# Patient Record
Sex: Male | Born: 1955
Health system: Southern US, Community
[De-identification: ages and names within clinical notes are randomized; demographics above are authoritative.]

## PROBLEM LIST (undated history)

## (undated) DIAGNOSIS — M779 Enthesopathy, unspecified: Secondary | ICD-10-CM

## (undated) DIAGNOSIS — I10 Essential (primary) hypertension: Secondary | ICD-10-CM

## (undated) DIAGNOSIS — K219 Gastro-esophageal reflux disease without esophagitis: Secondary | ICD-10-CM

## (undated) DIAGNOSIS — J45909 Unspecified asthma, uncomplicated: Secondary | ICD-10-CM

## (undated) DIAGNOSIS — E785 Hyperlipidemia, unspecified: Secondary | ICD-10-CM

## (undated) DIAGNOSIS — K635 Polyp of colon: Secondary | ICD-10-CM

## (undated) DIAGNOSIS — R011 Cardiac murmur, unspecified: Secondary | ICD-10-CM

## (undated) DIAGNOSIS — T7840XA Allergy, unspecified, initial encounter: Secondary | ICD-10-CM

## (undated) HISTORY — DX: Essential (primary) hypertension: I10

## (undated) HISTORY — PX: NASAL POLYP SURGERY: SHX186

## (undated) HISTORY — DX: Allergy, unspecified, initial encounter: T78.40XA

## (undated) HISTORY — DX: Hyperlipidemia, unspecified: E78.5

## (undated) HISTORY — DX: Cardiac murmur, unspecified: R01.1

## (undated) HISTORY — PX: TONSILLECTOMY: SUR1361

## (undated) HISTORY — DX: Unspecified asthma, uncomplicated: J45.909

## (undated) HISTORY — DX: Polyp of colon: K63.5

---

## 2011-09-09 ENCOUNTER — Encounter: Payer: Self-pay | Admitting: *Deleted

## 2011-09-09 ENCOUNTER — Emergency Department (INDEPENDENT_AMBULATORY_CARE_PROVIDER_SITE_OTHER): Payer: Managed Care, Other (non HMO)

## 2011-09-09 ENCOUNTER — Emergency Department (HOSPITAL_BASED_OUTPATIENT_CLINIC_OR_DEPARTMENT_OTHER)
Admission: EM | Admit: 2011-09-09 | Discharge: 2011-09-09 | Disposition: A | Discharge status: Other Acute Inpatient Hospital | Payer: Managed Care, Other (non HMO) | Attending: Emergency Medicine | Admitting: Emergency Medicine

## 2011-09-09 DIAGNOSIS — R070 Pain in throat: Secondary | ICD-10-CM | POA: Insufficient documentation

## 2011-09-09 DIAGNOSIS — S128XXA Fracture of other parts of neck, initial encounter: Secondary | ICD-10-CM | POA: Insufficient documentation

## 2011-09-09 DIAGNOSIS — R079 Chest pain, unspecified: Secondary | ICD-10-CM

## 2011-09-09 DIAGNOSIS — S199XXA Unspecified injury of neck, initial encounter: Secondary | ICD-10-CM

## 2011-09-09 DIAGNOSIS — IMO0002 Reserved for concepts with insufficient information to code with codable children: Secondary | ICD-10-CM

## 2011-09-09 DIAGNOSIS — Y93H2 Activity, gardening and landscaping: Secondary | ICD-10-CM

## 2011-09-09 DIAGNOSIS — R220 Localized swelling, mass and lump, head: Secondary | ICD-10-CM

## 2011-09-09 DIAGNOSIS — M542 Cervicalgia: Secondary | ICD-10-CM | POA: Insufficient documentation

## 2011-09-09 DIAGNOSIS — S0993XA Unspecified injury of face, initial encounter: Secondary | ICD-10-CM

## 2011-09-09 DIAGNOSIS — R221 Localized swelling, mass and lump, neck: Secondary | ICD-10-CM

## 2011-09-09 MED ORDER — DEXAMETHASONE SODIUM PHOSPHATE 10 MG/ML IJ SOLN
10.0000 mg | Freq: Once | INTRAMUSCULAR | Status: AC
  Administered 2011-09-09: 10 mg via INTRAVENOUS
  Filled 2011-09-09: qty 1

## 2011-09-09 MED ORDER — IOHEXOL 350 MG/ML SOLN
100.0000 mL | Freq: Once | INTRAVENOUS | Status: AC | PRN
  Administered 2011-09-09: 100 mL via INTRAVENOUS

## 2011-09-09 MED ORDER — MORPHINE SULFATE 4 MG/ML IJ SOLN
8.0000 mg | Freq: Once | INTRAMUSCULAR | Status: AC
  Administered 2011-09-09: 8 mg via INTRAVENOUS
  Filled 2011-09-09: qty 2

## 2011-09-09 MED ORDER — SODIUM CHLORIDE 0.9 % IV SOLN
Freq: Once | INTRAVENOUS | Status: AC
  Administered 2011-09-09: 500 mL via INTRAVENOUS

## 2011-09-09 NOTE — ED Provider Notes (Signed)
History     CSN: [id number] Arrival date & time: 09/09/2011  1:42 PM  Chief Complaint  Patient presents with  . Trauma    (Consider location/radiation/quality/duration/timing/severity/associated sxs/prior treatment) HPI  History reviewed. No pertinent past medical history.  History reviewed. No pertinent past surgical history.  History reviewed. No pertinent family history.  History  Substance Use Topics  . Smoking status: Not on file  . Smokeless tobacco: Not on file  . Alcohol Use: Yes      Review of Systems  Allergies  Niacin and related  Home Medications  No current outpatient prescriptions on file.  BP 123/69  Pulse 74  Temp(Src) 98.6 F (37 C) (Oral)  Resp 20  Ht 5\' 7"  (1.702 m)  Wt 180 lb (81.647 kg)  BMI 28.19 kg/m2  SpO2 98%  Physical Exam  ED Course  Procedures (including critical care time)  Labs Reviewed - No data to display Dg Neck Soft Tissue  09/09/2011  *RADIOLOGY REPORT*  Clinical Data: Injured by blunt lawnmower handle to neck. Struck in "Adam's apple"  NECK SOFT TISSUES - 1+ VIEW  Comparison:  None.  Findings:  There is no evidence of retropharyngeal soft tissue swelling or epiglottic enlargement.  The cervical airway is unremarkable and no radio-opaque foreign body identified.  IMPRESSION: Negative.  Original Report Authenticated By: Elsie Stain, M.D.   Dg Chest 2 View  09/09/2011  *RADIOLOGY REPORT*  Clinical Data: Blunt neck trauma.  Chest pain.  CHEST - 2 VIEW  Comparison: Soft tissue neck earlier today.  Findings:  The heart size and mediastinal contours are within normal limits.  Both lungs are clear.  The visualized skeletal structures are unremarkable. There is no visible pneumomediastinum. Trachea is midline.  Clavicles are symmetric and normal.  IMPRESSION: No active cardiopulmonary disease.  Original Report Authenticated By: Elsie Stain, M.D.   Ct Soft Tissue Neck W Contrast  09/09/2011  *RADIOLOGY REPORT*  Clinical Data:  Blunt trauma to the neck with lawn mower. Handle struck patient in Adam's apple.  CT NECK WITH CONTRAST  Technique:  Multidetector CT imaging of the neck was performed with intravenous contrast.  Contrast: OMNIPAQUE IOHEXOL 350 MG/ML IV SOLN  Comparison: Neck films 09/09/2011.  Findings: The anterior portion of the right thyroid cartilage is angled acutely at its junction with the more cartilagenous  body (series 7, images 59-61). A second fracture is suspected just below the thyroid notch.  There is slight supraglottic soft tissue swelling on the right.  The air column is slightly deviated from right to left but there is no significant narrowing.  True and false vocal cords appear intact.  Cricoid cartilage and arytenoids appear normally located.  There is no external soft tissue neck hematoma.  The hyoid cartilage is intact.  Craniocervical vasculature widely patent.  There is mild cervical spondylosis.  There is no adenopathy.  Salivary glands unremarkable.  Mandible and maxilla are intact.  There is mild chronic ethmoid sinus disease.  Visualized intracranial compartment unremarkable.  Subglottic region normal.  No pneumothorax.  No clavicular or sternal fracture.  IMPRESSION: Suspect  right thyroid cartilage fracture.  There is slight right- sided supraglottic soft tissue swelling and mild displacement of the supraglottic airway right-to-left.  Discussed with EDP.  Original Report Authenticated By: Elsie Stain, M.D.     No diagnosis found.    MDM  CT scan showed fracture of thyroid cartilage.  Arrangements made for transfer to [facility] to see a Scientist, forensic.  Airway patent, no stridor at time of transfer.       Geoffery Lyons, MD 09/10/11 2116

## 2011-09-09 NOTE — ED Notes (Signed)
Pt in xray

## 2011-09-09 NOTE — ED Notes (Signed)
Report given to Jesusita Oka, RN with [facility]- advise transport will be after 1900

## 2011-09-09 NOTE — ED Notes (Signed)
Pt states he was moving stuff and the cart slipped and the handlebar hit him in the neck. "adams apple pushed in" Now hurts in chest.

## 2011-09-09 NOTE — ED Provider Notes (Signed)
History     CSN: [id number] Arrival date & time: 09/09/2011  1:42 PM  Chief Complaint  Patient presents with  . Trauma     HPI Patient was removing his aerator from his truck when it rolled backwards and the handle struck him in the anterior neck.  This occurred just prior to arrival.  Reports ongoing pain in his throat and neck.  He's had no difficulty breathing.  He reports is able to keep down fluids without difficulty her pain.  His wife does report his voice sounds slightly hoarse and different.  He now reports mild superior chest pain without shortness of breath.  He's had no headache or weakness of his arms or legs.  He has no posterior neck pain.  He has not had fever.  His pain is moderate in nature it is sore and description.  He reports that it feels like his Adam's apple is pushed in.    History reviewed. No pertinent past medical history.  History reviewed. No pertinent past surgical history.  History reviewed. No pertinent family history.  History  Substance Use Topics  . Smoking status: Not on file  . Smokeless tobacco: Not on file  . Alcohol Use: Yes      Review of Systems  All other systems reviewed and are negative.    Allergies  Niacin and related  Home Medications  No current outpatient prescriptions on file.  BP 123/69  Pulse 74  Temp(Src) 98.6 F (37 C) (Oral)  Resp 20  Ht 5\' 7"  (1.702 m)  Wt 180 lb (81.647 kg)  BMI 28.19 kg/m2  SpO2 98%  Physical Exam  Nursing note and vitals reviewed. Constitutional: He is oriented to person, place, and time. He appears well-developed and well-nourished.  HENT:  Head: Normocephalic and atraumatic.  Eyes: EOM are normal.  Neck: Normal range of motion. Neck supple. No tracheal deviation present. No thyromegaly present.       No crepitus.  Mild dysphonia.  Trachea is midline.  No external signs of trauma including no bruising laceration or abrasion  Cardiovascular: Normal rate, regular rhythm, normal  heart sounds and intact distal pulses.   Pulmonary/Chest: Effort normal and breath sounds normal. No stridor. No respiratory distress.  Abdominal: Soft. He exhibits no distension. There is no tenderness.  Neurological: He is alert and oriented to person, place, and time.  Skin: Skin is warm and dry.  Psychiatric: He has a normal mood and affect. Judgment normal.    ED Course  Procedures (including critical care time)  Labs Reviewed - No data to display Dg Neck Soft Tissue  09/09/2011  *RADIOLOGY REPORT*  Clinical Data: Injured by blunt lawnmower handle to neck. Struck in "Adam's apple"  NECK SOFT TISSUES - 1+ VIEW  Comparison:  None.  Findings:  There is no evidence of retropharyngeal soft tissue swelling or epiglottic enlargement.  The cervical airway is unremarkable and no radio-opaque foreign body identified.  IMPRESSION: Negative.  Original Report Authenticated By: Elsie Stain, M.D.   Dg Chest 2 View  09/09/2011  *RADIOLOGY REPORT*  Clinical Data: Blunt neck trauma.  Chest pain.  CHEST - 2 VIEW  Comparison: Soft tissue neck earlier today.  Findings:  The heart size and mediastinal contours are within normal limits.  Both lungs are clear.  The visualized skeletal structures are unremarkable. There is no visible pneumomediastinum. Trachea is midline.  Clavicles are symmetric and normal.  IMPRESSION: No active cardiopulmonary disease.  Original Report Authenticated By: Jonny Ruiz  T. CURNES, M.D.   I personally reviewed the patient's imaging studies  No diagnosis found.    MDM  Plain films without any acute pathology.  Given his ongoing pain.  A CT scan of his neck with IV contrast is then obtained to evaluate for neck hematoma or other pathology.  The patient is tolerating by mouth fluids in the emergency department without difficulty or pain.  My suspicion for esophageal injury is low at this time.  Care to Dr. Judd Lien at this time to followup on CT scan        Lyanne Co,  MD 09/09/11 1640

## 2011-09-09 NOTE — ED Notes (Signed)
[facility] here to transport- report called to Victorio Palm, RN [facility] ED-

## 2012-02-16 ENCOUNTER — Observation Stay (HOSPITAL_COMMUNITY): Payer: Managed Care, Other (non HMO)

## 2012-02-16 ENCOUNTER — Encounter (HOSPITAL_COMMUNITY): Payer: Self-pay | Admitting: Physical Medicine and Rehabilitation

## 2012-02-16 ENCOUNTER — Observation Stay (HOSPITAL_COMMUNITY)
Admission: EM | Admit: 2012-02-16 | Discharge: 2012-02-16 | Disposition: A | Discharge status: Hospice | Payer: Managed Care, Other (non HMO) | Attending: Emergency Medicine | Admitting: Emergency Medicine

## 2012-02-16 ENCOUNTER — Emergency Department (HOSPITAL_COMMUNITY): Payer: Managed Care, Other (non HMO)

## 2012-02-16 ENCOUNTER — Other Ambulatory Visit: Payer: Self-pay

## 2012-02-16 DIAGNOSIS — R911 Solitary pulmonary nodule: Secondary | ICD-10-CM

## 2012-02-16 DIAGNOSIS — R079 Chest pain, unspecified: Principal | ICD-10-CM | POA: Insufficient documentation

## 2012-02-16 DIAGNOSIS — K219 Gastro-esophageal reflux disease without esophagitis: Secondary | ICD-10-CM | POA: Insufficient documentation

## 2012-02-16 HISTORY — DX: Enthesopathy, unspecified: M77.9

## 2012-02-16 HISTORY — DX: Gastro-esophageal reflux disease without esophagitis: K21.9

## 2012-02-16 LAB — BASIC METABOLIC PANEL
CO2: 29 mEq/L (ref 19–32)
Calcium: 9.7 mg/dL (ref 8.4–10.5)
Chloride: 104 mEq/L (ref 96–112)
Creatinine, Ser: 1.08 mg/dL (ref 0.50–1.35)
Glucose, Bld: 105 mg/dL — ABNORMAL HIGH (ref 70–99)

## 2012-02-16 LAB — CBC
HCT: 45.9 % (ref 39.0–52.0)
Hemoglobin: 16.2 g/dL (ref 13.0–17.0)
MCH: 30.1 pg (ref 26.0–34.0)
MCV: 85.2 fL (ref 78.0–100.0)
Platelets: 286 10*3/uL (ref 150–400)
RBC: 5.39 MIL/uL (ref 4.22–5.81)
WBC: 6.6 10*3/uL (ref 4.0–10.5)

## 2012-02-16 MED ORDER — SODIUM CHLORIDE 0.9 % IV BOLUS (SEPSIS)
500.0000 mL | Freq: Once | INTRAVENOUS | Status: AC
  Administered 2012-02-16: 500 mL via INTRAVENOUS

## 2012-02-16 MED ORDER — GI COCKTAIL ~~LOC~~
30.0000 mL | Freq: Once | ORAL | Status: AC
  Administered 2012-02-16: 30 mL via ORAL
  Filled 2012-02-16: qty 30

## 2012-02-16 MED ORDER — METOPROLOL TARTRATE 1 MG/ML IV SOLN
INTRAVENOUS | Status: AC
  Administered 2012-02-16: 5 mg
  Filled 2012-02-16: qty 5

## 2012-02-16 MED ORDER — SODIUM CHLORIDE 0.9 % IV BOLUS (SEPSIS)
1000.0000 mL | Freq: Once | INTRAVENOUS | Status: AC
  Administered 2012-02-16: 1000 mL via INTRAVENOUS

## 2012-02-16 MED ORDER — METOPROLOL TARTRATE 1 MG/ML IV SOLN
5.0000 mg | Freq: Once | INTRAVENOUS | Status: AC
  Administered 2012-02-16: 5 mg via INTRAVENOUS

## 2012-02-16 MED ORDER — IOHEXOL 350 MG/ML SOLN: 80.0000 mL | Freq: Once | INTRAVENOUS | Status: DC | PRN

## 2012-02-16 MED ORDER — MORPHINE SULFATE 4 MG/ML IJ SOLN
4.0000 mg | Freq: Once | INTRAMUSCULAR | Status: AC
  Administered 2012-02-16: 4 mg via INTRAVENOUS
  Filled 2012-02-16: qty 1

## 2012-02-16 MED ORDER — NITROGLYCERIN 0.4 MG SL SUBL
0.4000 mg | SUBLINGUAL_TABLET | Freq: Once | SUBLINGUAL | Status: AC
  Administered 2012-02-16: 0.4 mg via SUBLINGUAL

## 2012-02-16 MED ORDER — ONDANSETRON HCL 4 MG/2ML IJ SOLN
4.0000 mg | Freq: Once | INTRAMUSCULAR | Status: AC
  Administered 2012-02-16: 4 mg via INTRAVENOUS
  Filled 2012-02-16: qty 2

## 2012-02-16 NOTE — ED Notes (Signed)
Patient is resting comfortably. Pt returned from CT, denies any pain

## 2012-02-16 NOTE — ED Notes (Signed)
Pt presents to department via [facility] from home for evaluation of midsternal chest pain. Onset last night, pt states pain returned this morning while sitting at desk. States burning sensation radiating from middle of chest up to jaw and around to back. Denies pain upon arrival to ED. 18g LAC. 324 ASA at home. He is alert and oriented x4.

## 2012-02-16 NOTE — ED Notes (Signed)
Family at bedside. 

## 2012-02-16 NOTE — ED Provider Notes (Signed)
Medical screening examination/treatment/procedure(s) were performed by non-physician practitioner and as supervising physician I was immediately available for consultation/collaboration.   Gavin Pound. Zareena Willis, MD 02/16/12 1616

## 2012-02-16 NOTE — ED Provider Notes (Signed)
Pt in CDU on CP protocol.  Coronary CT has resulted and I have spoken w/ radiology.  No evidence of atherosclerosis and Calcium Score of 0.  There is myocardial bridging of LAD and the RCA extends inside R atrium (both of which are clinically irrelevant at this time).  There is sclerosis of aortic valve and it is possibly bicuspid.  Multiple pulmonary nodules.  All results discussed w/ pt.  He has a cardiologist and I recommended f/u this coming week.  Also recommended that he follow up with his primary care doctor for repeat CT chest in 3-6 months.  Return precautions including change in or worsening of CP discussed.  5:09 PM   Otilio Miu, PA 02/16/12 [phone]

## 2012-02-16 NOTE — Discharge Instructions (Signed)
It would likely benefit you to take an 81mg  aspirin every day.  Follow up with your cardiologist or one of his partners as soon as possible.  You should follow up with your family doctor to have pulmonary nodules rechecked in 3-6 months as well.  Your test results from today are attached.  You should return to the ER if your pain changes or worsens.

## 2012-02-16 NOTE — ED Provider Notes (Signed)
History     CSN: [id number]  Arrival date & time 02/16/12  1023   First MD Initiated Contact with Patient 02/16/12 1028      Chief Complaint  Patient presents with  . Chest Pain    (Consider location/radiation/quality/duration/timing/severity/associated sxs/prior treatment) HPI  Patient presents to the emergency department via EMS for evaluation of chest pains. [name] chest pains began last night and resolved. This morning while patient was sitting at his desk at work he developed chest pain began and called 911. The patient describes the pain as burning sensation from the middle of his chest that goes up to his jaw around. The pain lasted approximately 45 minutes and resolved. The patient was taken 324 mg of aspirin prior to arrival and upon my entering examined the patient is currently pain-free. The patient appears to be in no distress at this time is awake alert and oriented.  Past Medical History  Diagnosis Date  . GERD (gastroesophageal reflux disease)   . Tendonitis     History reviewed. No pertinent past surgical history.  History reviewed. No pertinent family history.  History  Substance Use Topics  . Smoking status: Never Smoker   . Smokeless tobacco: Not on file  . Alcohol Use: Yes      Review of Systems  All other systems reviewed and are negative.    Allergies  Niacin and related  Home Medications   Current Outpatient Rx  Name Route Sig Dispense Refill  . THERA M PLUS PO Oral Take 1 tablet by mouth daily.      BP 152/65  Pulse 53  Temp(Src) 98.7 F (37.1 C) (Oral)  Resp 17  SpO2 100%  Physical Exam  Nursing note and vitals reviewed. Constitutional: He appears well-developed and well-nourished. No distress.  HENT:  Head: Normocephalic and atraumatic.  Eyes: Pupils are equal, round, and reactive to light.  Neck: Normal range of motion. Neck supple.  Cardiovascular: Normal rate and regular rhythm.   Pulmonary/Chest: Effort normal.  No respiratory distress. He has no wheezes. He has no rales.  Abdominal: Soft.  Neurological: He is alert.  Skin: Skin is warm and dry. He is not diaphoretic.    ED Course  Procedures (including critical care time)  Labs Reviewed  BASIC METABOLIC PANEL - Abnormal; Notable for the following:    Glucose, Bld 105 (*)    GFR calc non Af Amer 76 (*)    GFR calc Af Amer 88 (*)    All other components within normal limits  TROPONIN I  CBC   Dg Chest 2 View  02/16/2012  *RADIOLOGY REPORT*  Clinical Data: Chest pain.  CHEST - 2 VIEW  Comparison: 09/09/2011  Findings: Heart and mediastinal contours are within normal limits. No focal opacities or effusions.  No acute bony abnormality.  Mild hyperinflation of the lungs.  IMPRESSION: No active cardiopulmonary disease.  Original Report Authenticated By: Cyndie Chime, M.D.     No diagnosis found.    MDM   Date: 02/16/2012  Rate: 55  Rhythm: normal sinus rhythm  QRS Axis: normal  Intervals: normal  ST/T Wave abnormalities: normal  Conduction Disutrbances:none  Narrative Interpretation: ventricular premature complexes  Old EKG Reviewed: none available  Pt discussed with Dr. Oletta Lamas who agrees that patient is a good candidate for CPP in CDU. I have spoken with Lorenz Coaster, PA-C in the CDU who has agreed to assume care of patient while on CPP. I have discussed the process with the  patient who is agreeable and willing to go through with the extra testing.        Dorthula Matas, PA 02/16/12 1513

## 2012-02-16 NOTE — ED Provider Notes (Signed)
Medical screening examination/treatment/procedure(s) were performed by non-physician practitioner and as supervising physician I was immediately available for consultation/collaboration. ECG shows no ischemia.  Troponin is neg.  Pt is 55, unexplained episodes of substernal CP's.  Will place in CDU for CP protocol.  Vital signs unremarkable.    Gavin Pound. Quanetta Truss, MD 02/16/12 [phone]

## 2012-02-16 NOTE — ED Notes (Signed)
Pt presents to department for evaluation of midsternal chest pain radiating to jaw and around to back. Onset last night, states pain went away but then returned this morning while working. No diaphoresis. States he is always SOB due to murmur. Skin warm and dry. Denies pain at the time. Lung sounds clear and equal bilaterally. No history of heart issues, states only GERD. Pt is conscious alert and oriented x4. No signs of acute distress at the time.

## 2012-02-16 NOTE — ED Provider Notes (Signed)
56 y/o Male PMH only sig for GERD, to the CDU on chest pain protocol. Chest pain began last night and recurred this morning, burning, central chest with rad to jaw. Spontaneous resolution after 45 minutes. Awaiting coronary CT.   On my assessment, pt is alert, oriented, and in NAD. Lungs are CTAB. Heart RRR. Abd soft, NT. MAEW. No needs at this time.    3:30 PM Pt has returned from coronary CT. Per discussion with Dr Olevia Bowens, pt received additional contrast that typical for the study due to difficulties with machine function as well as IV access. Recommendation to push fluids, IV NS bolus ordered. Pt in no distress.     4:02 PM Report given to PA Schinlever, who will continue care in the CDU.  Shaaron Adler, [place] 02/16/12 [phone]

## 2012-02-17 NOTE — ED Provider Notes (Signed)
Medical screening examination/treatment/procedure(s) were performed by non-physician practitioner and as supervising physician I was immediately available for consultation/collaboration.   Gavin Pound. [place] Lamons, MD 02/17/12 1610

## 2012-02-20 NOTE — Progress Notes (Signed)
Observation review is complete for 3/15.

## 2014-10-20 ENCOUNTER — Telehealth: Payer: Self-pay | Admitting: Cardiovascular Disease

## 2014-10-20 NOTE — Telephone Encounter (Signed)
Received records from [facility] (Dr Caryl AdaJenyung Andy Chiu) for appointment with Dr Tresa EndoKelly on 12/03/14.  [facility] Dr Landry DykeKelly's schedule on 12/03/14.  lp

## 2014-12-03 ENCOUNTER — Encounter: Payer: Self-pay | Admitting: Cardiovascular Disease

## 2014-12-03 ENCOUNTER — Ambulatory Visit (INDEPENDENT_AMBULATORY_CARE_PROVIDER_SITE_OTHER): Payer: BC Managed Care – PPO | Admitting: Cardiovascular Disease

## 2014-12-03 VITALS — BP 114/68 | HR 62 | Ht 67.0 in | Wt 181.1 lb

## 2014-12-03 DIAGNOSIS — R0789 Other chest pain: Secondary | ICD-10-CM

## 2014-12-03 DIAGNOSIS — I350 Nonrheumatic aortic (valve) stenosis: Secondary | ICD-10-CM

## 2014-12-03 DIAGNOSIS — R5383 Other fatigue: Secondary | ICD-10-CM

## 2014-12-03 DIAGNOSIS — R0600 Dyspnea, unspecified: Secondary | ICD-10-CM

## 2014-12-03 DIAGNOSIS — Z7901 Long term (current) use of anticoagulants: Secondary | ICD-10-CM

## 2014-12-03 DIAGNOSIS — R0602 Shortness of breath: Secondary | ICD-10-CM

## 2014-12-03 DIAGNOSIS — I340 Nonrheumatic mitral (valve) insufficiency: Secondary | ICD-10-CM

## 2014-12-03 DIAGNOSIS — Z79899 Other long term (current) drug therapy: Secondary | ICD-10-CM

## 2014-12-03 DIAGNOSIS — R0609 Other forms of dyspnea: Secondary | ICD-10-CM

## 2014-12-03 NOTE — Patient Instructions (Signed)
Your physician has requested that you have a RIGHT AND LEFT HEART cardiac catheterization performed by Dr. Tresa EndoKelly. Cardiac catheterization is used to diagnose and/or treat various heart conditions. Doctors may recommend this procedure for a number of different reasons. The most common reason is to evaluate chest pain. Chest pain can be a symptom of coronary artery disease (CAD), and cardiac catheterization can show whether plaque is narrowing or blocking your heart's arteries. This procedure is also used to evaluate the valves, as well as measure the blood flow and oxygen levels in different parts of your heart. For further information please visit [email or web address] Please follow instruction sheet, as given.  Your physician recommends that you return for lab work in: 3-5 days prior to the cardiac cath. A chest x-ray takes a picture of the organs and structures inside the chest, including the heart, lungs, and blood vessels. This test can show several things, including, whether the heart is enlarges; whether fluid is building up in the lungs.

## 2014-12-05 ENCOUNTER — Encounter: Payer: Self-pay | Admitting: Cardiovascular Disease

## 2014-12-05 DIAGNOSIS — R0600 Dyspnea, unspecified: Secondary | ICD-10-CM | POA: Insufficient documentation

## 2014-12-05 DIAGNOSIS — R0789 Other chest pain: Secondary | ICD-10-CM | POA: Insufficient documentation

## 2014-12-05 DIAGNOSIS — R0609 Other forms of dyspnea: Secondary | ICD-10-CM | POA: Insufficient documentation

## 2014-12-05 DIAGNOSIS — I340 Nonrheumatic mitral (valve) insufficiency: Secondary | ICD-10-CM | POA: Insufficient documentation

## 2014-12-05 DIAGNOSIS — I350 Nonrheumatic aortic (valve) stenosis: Secondary | ICD-10-CM | POA: Insufficient documentation

## 2014-12-05 NOTE — Progress Notes (Addendum)
Patient ID: Jay Hawkins, male   DOB: 06-18-1956, 59 y.o.   MRN: [id number]     PATIENT PROFILE: Jay BillowDon Dome is a 59 y.o. male who is referred by Dr. Eulah CitizenAndrew Chiu and the patient's interest in a second opinion concerning his valvular heart disease.   HPI:  Jay BillowDon Salomone is a 59 y.o. male who was born [place] the [place]  He has been told of having a cardiac murmur.  In June 2010 he underwent an echo Doppler study which revealed normal systolic function with left to hypertrophy and abnormal diastolic function.  His left atrium was mildly dilated.  Dense for a mildly calcified right coronary cusp of his aortic valve and he had a mean transvalvular aortic gradient of 10 mm and a peak gradient of 19 mm with mild aortic insufficiency.  There was mild mitral annular calcification with mild to moderate eccentric mitral regurgitation.  He was noted to have trivial pulmonic regurgitation.  In May 2013.  A subsequent echo Doppler study done at [facility] revealed an ejection fraction of 65% with mild LVH, mild left atrial enlargement, to have mild sclerosis with good mobility across his aortic valve without significant gradient or regurgitation.  His mitral regurgitation was now interpreted as mild.  In 2013 he underwent a CT angiography of his heart at which time his coronary artery calcium score was 0.  There was a short segment of shallow myocardial bridging in the mid LAD with less than 25% stenosis and was not felt to be due to the patient's symptoms of shortness of breath.  His aortic valve was sclerotic with possible commissural fusion between the noncoronary and right cusp, although this was not definitive.  He was also noted to have 2 very small pulmonary nodules in the left lung measuring up to 6 mm.  Patient has noticed episodes of increasing shortness of breath as well as chest tightness.  In addition to increasing fatigability.  He particularly has noticed shortness of  breath with step walking.  Subsequent evaluation in 2015 has revealed carotid duplex imaging which did not demonstrate any significant carotid artery stenosis and he had normal antegrade vertebral flow bilaterally.  An echo Doppler study in September 2015 showed normal systolic function with an ejection fraction at 70%, mild left atrial enlargement, mitral annular calcification with mild mitral regurgitation, and his aortic valve was sclerotic with mild stenosis and mild regurgitation with a mean gradient of 14.6 and a peak gradient now at 29 mm.  Because of recent increasing symptoms he underwent a stress echo in November 2015 which was normal without evidence for stress-induced wall motion abnormality.  The patient was last seen by Dr. Rhona Leavenshiu in November 2015.  The patient feels that his symptoms are getting worse and he is concerned that he may ultimately need valve surgery.  The patient now presents for cardiology evaluation with me.  He admits to chest tightness with activity.  He admits to rare episodes of lightheadedness.  He admits to exertional dyspnea.  He also admits to fatigue.  He is convinced that he has had a significant decline in exercise tolerance.  He is unaware of any snoring.  An Epworth Sleepiness Scale score today was calculated and this endorsed that 11 suggesting mild hypersomnolence.  Past Medical History  Diagnosis Date  . GERD (gastroesophageal reflux disease)   . Tendonitis     Surgical history is notable for tonsillectomy as well as nasal polyp removal in 2014.  Allergies  Allergen Reactions  . Niacin And Related Itching    Current Outpatient Prescriptions  Medication Sig Dispense Refill  . Cetirizine HCl (ZYRTEC ALLERGY PO) Take by mouth as needed.    Marland Kitchen lisinopril (PRINIVIL,ZESTRIL) 20 MG tablet   9  . Multiple Vitamins-Minerals (THERA M PLUS PO) Take 1 tablet by mouth daily.    . traMADol (ULTRAM) 50 MG tablet Take 50 mg by mouth.     No current  facility-administered medications for this visit.    Socially he was born in the [place]  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  He completed his education in [place]  He is married for 32 years.  There is remote 10 year history of tobacco use but he quit in 1996.  He does drink occasional wine.  He does exercise 3-4 days per week.   Family history is notable that both parents died at age 43 with colon cancer.  He has 3 children ages 32, 61, and 67 and 4 grandchildren.  ROS General: Negative; No fevers, chills, or night sweats HEENT: Negative; No changes in vision or hearing, sinus congestion, difficulty swallowing Pulmonary: Negative; No cough, wheezing, shortness of breath, hemoptysis Cardiovascular:  See HPI;  GI: Negative; No nausea, vomiting, diarrhea, or abdominal pain GU: Negative; No dysuria, hematuria, or difficulty voiding Musculoskeletal: Negative; no myalgias, joint pain, or weakness Hematologic/Oncologic: Negative; no easy bruising, bleeding Endocrine: Negative; no heat/cold intolerance; no diabetes Neuro: Negative; no changes in balance, headaches Skin: Negative; No rashes or skin lesions Psychiatric: Negative; No behavioral problems, depression Sleep: Mild fatigue with minimal daytime sleepiness; no snoring, bruxism, restless legs, hypnogagnic hallucinations Other comprehensive 14 point system review is negative   Physical Exam BP 114/68 mmHg  Pulse 62  Ht  (1.702 m)  Wt 181 lb 1.6 oz (82.146 kg)  BMI 28.36 kg/m2  Repeat blood pressure by me was 128/70 supine and 124/70 standing. General: Alert, oriented, no distress.  Skin: normal turgor, no rashes, warm and dry HEENT: Normocephalic, atraumatic. Pupils equal round and reactive to light; sclera anicteric; extraocular muscles intact; Fundi normal vessels.  No hemorrhages or exudates Nose without nasal septal hypertrophy Mouth/Parynx benign;  Mallinpatti scale 3 Neck: No JVD; normal carotid upstroke with probable transmitted murmur Lungs: clear to ausculatation and percussion; no wheezing or rales Chest wall: without tenderness to palpitation Heart: PMI not displaced, RRR, s1 s2 normal, 2/6 systolic murmur heard best in the aortic area radiating to the carotids bilaterally, no diastolic murmur, no rubs, gallops, thrills, or heaves Abdomen: soft, nontender; no hepatosplenomehaly, BS+; abdominal aorta nontender and not dilated by palpation. Back: no CVA tenderness Pulses 2+ Musculoskeletal: full range of motion, normal strength, no joint deformities Extremities: no clubbing cyanosis or edema, Homan's sign negative  Neurologic: grossly nonfocal; Cranial nerves grossly wnl Psychologic: Normal mood and affect   ECG (independently read by me): Normal sinus rhythm at 62 bpm.  No significant ST-T changes.  Normal intervals.  LABS:  BMET    Component Value Date/Time   NA 140 02/16/2012 1057   K 4.1 02/16/2012 1057   CL 104 02/16/2012 1057   CO2 29 02/16/2012 1057   GLUCOSE 105* 02/16/2012 1057   BUN 12 02/16/2012 1057   CREATININE 1.08 02/16/2012 1057   CALCIUM 9.7 02/16/2012 1057   GFRNONAA 76* 02/16/2012 1057   GFRAA 88* 02/16/2012 1057     Hepatic Function Panel  No results found for: PROT,  ALBUMIN, AST, ALT, ALKPHOS, BILITOT, BILIDIR, IBILI   CBC    Component Value Date/Time   WBC 6.6 02/16/2012 1057   RBC 5.39 02/16/2012 1057   HGB 16.2 02/16/2012 1057   HCT 45.9 02/16/2012 1057   PLT 286 02/16/2012 1057   MCV 85.2 02/16/2012 1057   MCH 30.1 02/16/2012 1057   MCHC 35.3 02/16/2012 1057   RDW 12.9 02/16/2012 1057     BNP No results found for: PROBNP  Lipid Panel  No results found for: CHOL, TRIG, HDL, CHOLHDL, VLDL, LDLCALC, LDLDIRECT    RADIOLOGY: No results found.   ASSESSMENT AND PLAN:  Mr. Jamoni Hewes is a 59 year old [place] gentleman who presented to the office today for cardiology  evaluation of his valvular abnormalities which have been followed for the past 5 years by his physicians in [facility].  I reviewed all the data since his initial echo in 2010.  His most recent echo Doppler study has shown mild aortic valve stenosis with mild mitral regurgitation and I agree with Dr. Rhona Leavens that based on these data, it may not be definitive in the causation of his change in symptoms status.  However, it appears that the patient's primary problem may be that of aortic stenosis.  He is definitive in stating that his exertional tolerance has significantly reduced by this attempt to exercise regularly.  He has experienced exertional dyspnea and also has noticed some episodes of chest tightness.  There is no evidence for CHF on physical examination today.  His ECG remains unremarkable.  A remote CT angiogram had not demonstrated fixed obstructive CAD.  With his increasing symptomatology, I have discussed many options with the patient.  He is very concerned that he may ultimately require need for valve surgery.  It is my recommendation that definitive right and left heart catheterization be performed to further evaluate his valvular disease and to make certain he has not developed CAD.  I discussed the risks and benefits of the catheterization procedure  including, but not limited to, death, stroke, MI, kidney damage and bleeding were discussed with the patient who indicates understanding and agrees to proceed.  Laboratory will be obtained prior to the catheterizations study, which will be performed next week at [facility].    Lennette Bihari, MD, [facility] 12/05/2014 12:03 PM

## 2014-12-07 ENCOUNTER — Ambulatory Visit (HOSPITAL_BASED_OUTPATIENT_CLINIC_OR_DEPARTMENT_OTHER)
Admission: RE | Admit: 2014-12-07 | Discharge: 2014-12-07 | Disposition: A | Discharge status: Home / Self Care | Payer: BLUE CROSS/BLUE SHIELD | Source: Ambulatory Visit | Attending: Cardiovascular Disease | Admitting: Cardiovascular Disease

## 2014-12-07 DIAGNOSIS — R0602 Shortness of breath: Secondary | ICD-10-CM | POA: Insufficient documentation

## 2014-12-07 DIAGNOSIS — R0789 Other chest pain: Secondary | ICD-10-CM

## 2014-12-07 LAB — CBC
HCT: 47 % (ref 39.0–52.0)
HEMOGLOBIN: 15.6 g/dL (ref 13.0–17.0)
MCH: 29.2 pg (ref 26.0–34.0)
MCHC: 33.2 g/dL (ref 30.0–36.0)
MCV: 88 fL (ref 78.0–100.0)
MPV: 9.6 fL (ref 8.6–12.4)
Platelets: 308 10*3/uL (ref 150–400)
RBC: 5.34 MIL/uL (ref 4.22–5.81)
RDW: 13.8 % (ref 11.5–15.5)
WBC: 5.9 10*3/uL (ref 4.0–10.5)

## 2014-12-07 LAB — BASIC METABOLIC PANEL
BUN: 13 mg/dL (ref 6–23)
CALCIUM: 9.3 mg/dL (ref 8.4–10.5)
CO2: 26 mEq/L (ref 19–32)
Chloride: 104 mEq/L (ref 96–112)
Creat: 1.1 mg/dL (ref 0.50–1.35)
GLUCOSE: 90 mg/dL (ref 70–99)
POTASSIUM: 4 meq/L (ref 3.5–5.3)
SODIUM: 142 meq/L (ref 135–145)

## 2014-12-07 LAB — PROTIME-INR
INR: 0.96 (ref <1.50)
PROTHROMBIN TIME: 12.8 s (ref 11.6–15.2)

## 2014-12-07 LAB — APTT: aPTT: 33 seconds (ref 24–37)

## 2014-12-07 LAB — TSH: TSH: 1.447 u[IU]/mL (ref 0.350–4.500)

## 2014-12-08 ENCOUNTER — Other Ambulatory Visit: Payer: Self-pay | Admitting: *Deleted

## 2014-12-08 DIAGNOSIS — R079 Chest pain, unspecified: Secondary | ICD-10-CM

## 2014-12-08 DIAGNOSIS — R0602 Shortness of breath: Secondary | ICD-10-CM

## 2014-12-08 DIAGNOSIS — I350 Nonrheumatic aortic (valve) stenosis: Secondary | ICD-10-CM

## 2014-12-11 ENCOUNTER — Encounter (HOSPITAL_COMMUNITY): Payer: Self-pay | Admitting: Cardiovascular Disease

## 2014-12-11 ENCOUNTER — Ambulatory Visit (HOSPITAL_COMMUNITY)
Admission: RE | Admit: 2014-12-11 | Discharge: 2014-12-11 | Disposition: A | Discharge status: Home / Self Care | Payer: BLUE CROSS/BLUE SHIELD | Source: Ambulatory Visit | Attending: Cardiovascular Disease | Admitting: Cardiovascular Disease

## 2014-12-11 ENCOUNTER — Encounter (HOSPITAL_COMMUNITY)
Admission: RE | Disposition: A | Discharge status: Home / Self Care | Payer: Self-pay | Source: Ambulatory Visit | Attending: Cardiovascular Disease

## 2014-12-11 DIAGNOSIS — Z79899 Other long term (current) drug therapy: Secondary | ICD-10-CM | POA: Diagnosis not present

## 2014-12-11 DIAGNOSIS — R0602 Shortness of breath: Secondary | ICD-10-CM | POA: Insufficient documentation

## 2014-12-11 DIAGNOSIS — I340 Nonrheumatic mitral (valve) insufficiency: Secondary | ICD-10-CM | POA: Diagnosis not present

## 2014-12-11 DIAGNOSIS — K219 Gastro-esophageal reflux disease without esophagitis: Secondary | ICD-10-CM | POA: Diagnosis not present

## 2014-12-11 DIAGNOSIS — I351 Nonrheumatic aortic (valve) insufficiency: Secondary | ICD-10-CM | POA: Diagnosis not present

## 2014-12-11 DIAGNOSIS — R079 Chest pain, unspecified: Secondary | ICD-10-CM | POA: Insufficient documentation

## 2014-12-11 DIAGNOSIS — I2510 Atherosclerotic heart disease of native coronary artery without angina pectoris: Secondary | ICD-10-CM | POA: Insufficient documentation

## 2014-12-11 DIAGNOSIS — I350 Nonrheumatic aortic (valve) stenosis: Secondary | ICD-10-CM

## 2014-12-11 HISTORY — PX: LEFT HEART CATHETERIZATION WITH CORONARY ANGIOGRAM: SHX5451

## 2014-12-11 LAB — POCT I-STAT 3, ART BLOOD GAS (G3+)
ACID-BASE DEFICIT: 1 mmol/L (ref 0.0–2.0)
Bicarbonate: 24.8 mEq/L — ABNORMAL HIGH (ref 20.0–24.0)
O2 Saturation: 99 %
PO2 ART: 151 mmHg — AB (ref 80.0–100.0)
TCO2: 26 mmol/L (ref 0–100)
pCO2 arterial: 44.2 mmHg (ref 35.0–45.0)
pH, Arterial: 7.356 (ref 7.350–7.450)

## 2014-12-11 LAB — POCT I-STAT 3, VENOUS BLOOD GAS (G3P V)
Acid-base deficit: 1 mmol/L (ref 0.0–2.0)
Bicarbonate: 24.8 mEq/L — ABNORMAL HIGH (ref 20.0–24.0)
O2 Saturation: 77 %
PCO2 VEN: 46.3 mmHg (ref 45.0–50.0)
TCO2: 26 mmol/L (ref 0–100)
pH, Ven: 7.337 — ABNORMAL HIGH (ref 7.250–7.300)
pO2, Ven: 45 mmHg (ref 30.0–45.0)

## 2014-12-11 SURGERY — LEFT HEART CATHETERIZATION WITH CORONARY ANGIOGRAM
Anesthesia: LOCAL

## 2014-12-11 MED ORDER — HEPARIN (PORCINE) IN NACL 2-0.9 UNIT/ML-% IJ SOLN
INTRAMUSCULAR | Status: AC
  Filled 2014-12-11: qty 1000

## 2014-12-11 MED ORDER — SODIUM CHLORIDE 0.9 % IV SOLN: INTRAVENOUS | Status: DC

## 2014-12-11 MED ORDER — SODIUM CHLORIDE 0.9 % IV SOLN
INTRAVENOUS | Status: DC
  Administered 2014-12-11: 10:00:00 via INTRAVENOUS

## 2014-12-11 MED ORDER — ONDANSETRON HCL 4 MG/2ML IJ SOLN: 4.0000 mg | Freq: Four times a day (QID) | INTRAMUSCULAR | Status: DC | PRN

## 2014-12-11 MED ORDER — ASPIRIN 81 MG PO CHEW: 81.0000 mg | CHEWABLE_TABLET | ORAL | Status: DC

## 2014-12-11 MED ORDER — DIAZEPAM 5 MG PO TABS: 5.0000 mg | ORAL_TABLET | ORAL | Status: DC

## 2014-12-11 MED ORDER — NITROGLYCERIN 1 MG/10 ML FOR IR/CATH LAB
INTRA_ARTERIAL | Status: AC
  Filled 2014-12-11: qty 10

## 2014-12-11 MED ORDER — FENTANYL CITRATE 0.05 MG/ML IJ SOLN
INTRAMUSCULAR | Status: AC
  Filled 2014-12-11: qty 2

## 2014-12-11 MED ORDER — SODIUM CHLORIDE 0.9 % IJ SOLN: 3.0000 mL | INTRAMUSCULAR | Status: DC | PRN

## 2014-12-11 MED ORDER — ASPIRIN 81 MG PO CHEW
CHEWABLE_TABLET | ORAL | Status: AC
  Administered 2014-12-11: 81 mg
  Filled 2014-12-11: qty 1

## 2014-12-11 MED ORDER — ACETAMINOPHEN 325 MG PO TABS: 650.0000 mg | ORAL_TABLET | ORAL | Status: DC | PRN

## 2014-12-11 MED ORDER — DIAZEPAM 5 MG PO TABS
ORAL_TABLET | ORAL | Status: AC
  Administered 2014-12-11: 5 mg
  Filled 2014-12-11: qty 1

## 2014-12-11 MED ORDER — ASPIRIN EC 81 MG PO TBEC: 81.0000 mg | DELAYED_RELEASE_TABLET | Freq: Every day | ORAL | Status: DC

## 2014-12-11 MED ORDER — MIDAZOLAM HCL 2 MG/2ML IJ SOLN
INTRAMUSCULAR | Status: AC
  Filled 2014-12-11: qty 2

## 2014-12-11 MED ORDER — LIDOCAINE HCL (PF) 1 % IJ SOLN
INTRAMUSCULAR | Status: AC
  Filled 2014-12-11: qty 30

## 2014-12-11 NOTE — H&P (View-Only) (Signed)
Patient ID: Jay Hawkins, male   DOB: 06-18-1956, 59 y.o.   MRN: [id number]     PATIENT PROFILE: Jay Hawkins is a 59 y.o. male who is referred by Dr. Eulah CitizenAndrew Chiu and the patient's interest in a second opinion concerning his valvular heart disease.   HPI:  Jay Hawkins is a 59 y.o. male who was born [place]  He has been told of having a cardiac murmur.  In June 2010 he underwent an echo Doppler study which revealed normal systolic function with left to hypertrophy and abnormal diastolic function.  His left atrium was mildly dilated.  Dense for a mildly calcified right coronary cusp of his aortic valve and he had a mean transvalvular aortic gradient of 10 mm and a peak gradient of 19 mm with mild aortic insufficiency.  There was mild mitral annular calcification with mild to moderate eccentric mitral regurgitation.  He was noted to have trivial pulmonic regurgitation.  In May 2013.  A subsequent echo Doppler study done at [facility] in [facility] revealed an ejection fraction of 65% with mild LVH, mild left atrial enlargement, to have mild sclerosis with good mobility across his aortic valve without significant gradient or regurgitation.  His mitral regurgitation was now interpreted as mild.  In 2013 he underwent a CT angiography of his heart at which time his coronary artery calcium score was 0.  There was a short segment of shallow myocardial bridging in the mid LAD with less than 25% stenosis and was not felt to be due to the patient's symptoms of shortness of breath.  His aortic valve was sclerotic with possible commissural fusion between the noncoronary and right cusp, although this was not definitive.  He was also noted to have 2 very small pulmonary nodules in the left lung measuring up to 6 mm.  Patient has noticed episodes of increasing shortness of breath as well as chest tightness.  In addition to increasing fatigability.  He particularly has noticed shortness of  breath with step walking.  Subsequent evaluation in 2015 has revealed carotid duplex imaging which did not demonstrate any significant carotid artery stenosis and he had normal antegrade vertebral flow bilaterally.  An echo Doppler study in September 2015 showed normal systolic function with an ejection fraction at 70%, mild left atrial enlargement, mitral annular calcification with mild mitral regurgitation, and his aortic valve was sclerotic with mild stenosis and mild regurgitation with a mean gradient of 14.6 and a peak gradient now at 29 mm.  Because of recent increasing symptoms he underwent a stress echo in November 2015 which was normal without evidence for stress-induced wall motion abnormality.  The patient was last seen by Dr. Rhona Leavenshiu in November 2015.  The patient feels that his symptoms are getting worse and he is concerned that he may ultimately need valve surgery.  The patient now presents for cardiology evaluation with me.  He admits to chest tightness with activity.  He admits to rare episodes of lightheadedness.  He admits to exertional dyspnea.  He also admits to fatigue.  He is convinced that he has had a significant decline in exercise tolerance.  He is unaware of any snoring.  An Epworth Sleepiness Scale score today was calculated and this endorsed that 11 suggesting mild hypersomnolence.  Past Medical History  Diagnosis Date  . GERD (gastroesophageal reflux disease)   . Tendonitis     Surgical history is notable for tonsillectomy as well as nasal polyp removal in 2014.  Allergies  Allergen Reactions  . Niacin And Related Itching    Current Outpatient Prescriptions  Medication Sig Dispense Refill  . Cetirizine HCl (ZYRTEC ALLERGY PO) Take by mouth as needed.    Marland Kitchen lisinopril (PRINIVIL,ZESTRIL) 20 MG tablet   9  . Multiple Vitamins-Minerals (THERA M PLUS PO) Take 1 tablet by mouth daily.    . traMADol (ULTRAM) 50 MG tablet Take 50 mg by mouth.     No current  facility-administered medications for this visit.    Socially he was born [place]  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  He completed his education [place]  He is married for 32 years.  There is remote 10 year history of tobacco use but he quit in 1996.  He does drink occasional wine.  He does exercise 3-4 days per week.   Family history is notable that both parents died at age 43 with colon cancer.  He has 3 children ages 32, 61, and 67 and 4 grandchildren.  ROS General: Negative; No fevers, chills, or night sweats HEENT: Negative; No changes in vision or hearing, sinus congestion, difficulty swallowing Pulmonary: Negative; No cough, wheezing, shortness of breath, hemoptysis Cardiovascular:  See HPI;  GI: Negative; No nausea, vomiting, diarrhea, or abdominal pain GU: Negative; No dysuria, hematuria, or difficulty voiding Musculoskeletal: Negative; no myalgias, joint pain, or weakness Hematologic/Oncologic: Negative; no easy bruising, bleeding Endocrine: Negative; no heat/cold intolerance; no diabetes Neuro: Negative; no changes in balance, headaches Skin: Negative; No rashes or skin lesions Psychiatric: Negative; No behavioral problems, depression Sleep: Mild fatigue with minimal daytime sleepiness; no snoring, bruxism, restless legs, hypnogagnic hallucinations Other comprehensive 14 point system review is negative   Physical Exam BP 114/68 mmHg  Pulse 62  Ht  (1.702 m)  Wt 181 lb 1.6 oz (82.146 kg)  BMI 28.36 kg/m2  Repeat blood pressure by me was 128/70 supine and 124/70 standing. General: Alert, oriented, no distress.  Skin: normal turgor, no rashes, warm and dry HEENT: Normocephalic, atraumatic. Pupils equal round and reactive to light; sclera anicteric; extraocular muscles intact; Fundi normal vessels.  No hemorrhages or exudates Nose without nasal septal hypertrophy Mouth/Parynx benign;  Mallinpatti scale 3 Neck: No JVD; normal carotid upstroke with probable transmitted murmur Lungs: clear to ausculatation and percussion; no wheezing or rales Chest wall: without tenderness to palpitation Heart: PMI not displaced, RRR, s1 s2 normal, 2/6 systolic murmur heard best in the aortic area radiating to the carotids bilaterally, no diastolic murmur, no rubs, gallops, thrills, or heaves Abdomen: soft, nontender; no hepatosplenomehaly, BS+; abdominal aorta nontender and not dilated by palpation. Back: no CVA tenderness Pulses 2+ Musculoskeletal: full range of motion, normal strength, no joint deformities Extremities: no clubbing cyanosis or edema, Homan's sign negative  Neurologic: grossly nonfocal; Cranial nerves grossly wnl Psychologic: Normal mood and affect   ECG (independently read by me): Normal sinus rhythm at 62 bpm.  No significant ST-T changes.  Normal intervals.  LABS:  BMET    Component Value Date/Time   NA 140 02/16/2012 1057   K 4.1 02/16/2012 1057   CL 104 02/16/2012 1057   CO2 29 02/16/2012 1057   GLUCOSE 105* 02/16/2012 1057   BUN 12 02/16/2012 1057   CREATININE 1.08 02/16/2012 1057   CALCIUM 9.7 02/16/2012 1057   GFRNONAA 76* 02/16/2012 1057   GFRAA 88* 02/16/2012 1057     Hepatic Function Panel  No results found for: PROT,  ALBUMIN, AST, ALT, ALKPHOS, BILITOT, BILIDIR, IBILI   CBC    Component Value Date/Time   WBC 6.6 02/16/2012 1057   RBC 5.39 02/16/2012 1057   HGB 16.2 02/16/2012 1057   HCT 45.9 02/16/2012 1057   PLT 286 02/16/2012 1057   MCV 85.2 02/16/2012 1057   MCH 30.1 02/16/2012 1057   MCHC 35.3 02/16/2012 1057   RDW 12.9 02/16/2012 1057     BNP No results found for: PROBNP  Lipid Panel  No results found for: CHOL, TRIG, HDL, CHOLHDL, VLDL, LDLCALC, LDLDIRECT    RADIOLOGY: No results found.   ASSESSMENT AND PLAN:  Mr. Vickey Ewbank is a 59 year old [place] gentleman who presented to the office today for cardiology  evaluation of his valvular abnormalities which have been followed for the past 5 years by his physicians in [facility].  I reviewed all the data since his initial echo in 2010.  His most recent echo Doppler study has shown mild aortic valve stenosis with mild mitral regurgitation and I agree with Dr. Rhona Leavens that based on these data, it may not be definitive in the causation of his change in symptoms status.  However, it appears that the patient's primary problem may be that of aortic stenosis.  He is definitive in stating that his exertional tolerance has significantly reduced by this attempt to exercise regularly.  He has experienced exertional dyspnea and also has noticed some episodes of chest tightness.  There is no evidence for CHF on physical examination today.  His ECG remains unremarkable.  A remote CT angiogram had not demonstrated fixed obstructive CAD.  With his increasing symptomatology, I have discussed many options with the patient.  He is very concerned that he may ultimately require need for valve surgery.  It is my recommendation that definitive right and left heart catheterization be performed to further evaluate his valvular disease and to make certain he has not developed CAD.  I discussed the risks and benefits of the catheterization procedure  including, but not limited to, death, stroke, MI, kidney damage and bleeding were discussed with the patient who indicates understanding and agrees to proceed.  Laboratory will be obtained prior to the catheterizations study, which will be performed next week at [facility].    Lennette Bihari, MD, [facility] 12/05/2014 12:03 PM

## 2014-12-11 NOTE — Interval H&P Note (Signed)
Cath Lab Visit (complete for each Cath Lab visit)  Clinical Evaluation Leading to the Procedure:   ACS: No.  Non-ACS:    Anginal Classification: CCS II  Anti-ischemic medical therapy: Minimal Therapy (1 class of medications)  Non-Invasive Test Results: No non-invasive testing performed  Prior CABG: No previous CABG      History and Physical Interval Note:  12/11/2014 1:45 PM  Jay Hawkins  has presented today for surgery, with the diagnosis of chest pain  The various methods of treatment have been discussed with the patient and family. After consideration of risks, benefits and other options for treatment, the patient has consented to  Procedure(s): LEFT HEART CATHETERIZATION WITH CORONARY ANGIOGRAM (N/Hawkins) as Hawkins surgical intervention .  [name] history has been reviewed, patient examined, no change in status, stable for surgery.  I have reviewed the patient's chart and labs.  Questions were answered to the patient's satisfaction.     Jay Hawkins

## 2014-12-11 NOTE — Discharge Instructions (Signed)

## 2014-12-11 NOTE — Progress Notes (Signed)
Site area:5 fr arterial and 7 fr venous removed Site Prior to Removal:  Level 0 Pressure Applied For: 20 min Manual:   yes Patient Status During Pull:  vss/stable Post Pull Site:  Level 0 Post Pull Instructions Given:  yes Post Pull Pulses Present: present 2#dp Dressing Applied:  Dry clean dressing applied Bedrest begins @ 1510 Comments: no pain Vital remain stable

## 2014-12-11 NOTE — CV Procedure (Addendum)
  Jay BillowDon Yaeger is a 59 y.o. male   [id number]  [id number] LOCATION:  FACILITY: [facility]  PHYSICIAN: Lennette Biharihomas A. Kelly, MD, [facility] 1956/07/18   DATE OF PROCEDURE:  12/11/2014      RIGHT AND LEFT HEART CARDIAC CATHETERIZATION   HISTORY:  Jay Hawkins is a 59 y.o. male who is referred for diagnostic right and left heart cardiac catheterization to further right evaluate increasing symptoms of shortness of breath with activity and chest tightness.  He is been found to have mild mitral regurgitation and mild aortic stenosis on echocardiography.    PROCEDURE:  Right and left heart catheterization: Swan-Ganz catheterization, cardiac determinations by the thermodilution and assumed Fick method, coronary angiography, left ventriculography.  The patient was brought to the second floor [place] [facility] cath lab in the fasting state. Versed 2 mg and fentanyl 50 mcg were administered for conscious sedation. The right groin was prepped and draped in sterile fashion and a 5 [place] arterial sheath and 7 French venous sheath were inserted without difficulty. A Swan-Ganz catheter was advanced into the venous sheath and pressures were obtained in the right atrium, right ventricle, pulmonary artery, and pulmonary capillary wedge position. Cardiac outputs were obtained by the thermodilution and assumed Fick methods. Oxygen saturation was obtained in the pulmonary artery and aorta. A pigtail catheter was inserted and simultaneous AO/PA pressures were recorded. The pigtail catheter was advanced into the left ventricle and simultaneous left ventricular and PCW pressures were recorded. Left ventriculography was performed in the RAO projection.  A left ventricle to aorta pullback was performed. The pigtail catheter was then removed and diagnostic catheterization to delineate the coronary anatomy was performed utilizing 5 French Judkins 4 left and right diagnostic catheters. All catheters were removed and the patient. Hemostasis  was obtained by direct manual pressure. The patient tolerated the procedure well and returned to his room in satisfactory condition.   HEMODYNAMICS:   RA: 4 RV: 21/5 PA: 21/7 PC: 11  LV: 116/12 AO: 111/54  Oxygen saturation in the aorta 99% and the pulmonary artery 77%  Cardiac output: 5.1 l/min (Thermo); 5.4 (Fick)  Cardiac index: 2.9. l/m/m2                2.7  ANGIOGRAPHY:   Left main: Angiographically normal and trifurcated into an LAD, a ramus intermediate vessel, and the left circumflex coronary artery.  LAD: Moderate size vessel that gave rise to 2 proximal diagonal vessels and several  septal perforating arteries.  After the second diagonal vessel a portion of the LAD seemed to dip mildly into the myocardium but there was no significant evidence of systolic bridging.  Ramus intermediate: Normal small vessel  Left circumflex: Normal vessel which gave rise to one major marginal branch  Right coronary artery: Normal dominant RCA which supplied the PDA  Left ventriculography  revealed normal systolic function.  There was very mild mitral regurgitation.  The aortic valve was not calcified.  The aortic root was not dilated.  Ejection fraction was 55-60%.   IMPRESSION:  No significant coronary obstructive disease with a small portion of the mid LAD dipping intramyocardially without evidence for systolic bridging.  Normal left ventricular function without focal segmental wall motion abnormalities.  Mild mitral valve regurgitation.  No evidence for significant valvular aortic stenosis.  Normal right heart pressures.    Lennette Biharihomas A. Kelly, MD, [facility] 12/11/2014 4:39 PM   Copy to Dr. Rhona Leavenshiu

## 2014-12-24 ENCOUNTER — Encounter: Payer: Self-pay | Admitting: Cardiovascular Disease

## 2015-07-19 ENCOUNTER — Telehealth: Payer: Self-pay | Admitting: Cardiovascular Disease

## 2015-07-19 NOTE — Telephone Encounter (Signed)
Closed encounter °

## 2016-03-20 DIAGNOSIS — J4521 Mild intermittent asthma with (acute) exacerbation: Secondary | ICD-10-CM | POA: Diagnosis not present

## 2016-03-20 DIAGNOSIS — J301 Allergic rhinitis due to pollen: Secondary | ICD-10-CM | POA: Diagnosis not present

## 2016-03-20 DIAGNOSIS — I10 Essential (primary) hypertension: Secondary | ICD-10-CM | POA: Diagnosis not present

## 2016-08-09 DIAGNOSIS — I350 Nonrheumatic aortic (valve) stenosis: Secondary | ICD-10-CM | POA: Diagnosis not present

## 2016-08-09 DIAGNOSIS — R42 Dizziness and giddiness: Secondary | ICD-10-CM | POA: Diagnosis not present

## 2016-08-09 DIAGNOSIS — I10 Essential (primary) hypertension: Secondary | ICD-10-CM | POA: Diagnosis not present

## 2016-08-09 DIAGNOSIS — Z23 Encounter for immunization: Secondary | ICD-10-CM | POA: Diagnosis not present

## 2016-08-21 DIAGNOSIS — R790 Abnormal level of blood mineral: Secondary | ICD-10-CM | POA: Diagnosis not present

## 2016-08-28 DIAGNOSIS — I340 Nonrheumatic mitral (valve) insufficiency: Secondary | ICD-10-CM | POA: Diagnosis not present

## 2016-08-28 DIAGNOSIS — Z6827 Body mass index (BMI) 27.0-27.9, adult: Secondary | ICD-10-CM | POA: Diagnosis not present

## 2016-08-28 DIAGNOSIS — R42 Dizziness and giddiness: Secondary | ICD-10-CM | POA: Diagnosis not present

## 2016-08-28 DIAGNOSIS — I350 Nonrheumatic aortic (valve) stenosis: Secondary | ICD-10-CM | POA: Diagnosis not present

## 2016-09-06 DIAGNOSIS — I350 Nonrheumatic aortic (valve) stenosis: Secondary | ICD-10-CM | POA: Diagnosis not present

## 2017-03-20 ENCOUNTER — Telehealth: Payer: Self-pay

## 2017-03-20 NOTE — Telephone Encounter (Signed)
Pre visit call completed 

## 2017-03-21 ENCOUNTER — Encounter: Payer: Self-pay | Admitting: Family Medicine

## 2017-03-21 ENCOUNTER — Ambulatory Visit (INDEPENDENT_AMBULATORY_CARE_PROVIDER_SITE_OTHER): Payer: BLUE CROSS/BLUE SHIELD | Admitting: Family Medicine

## 2017-03-21 VITALS — BP 138/64 | HR 65 | Temp 98.1°F | Resp 16 | Ht 67.0 in | Wt 176.0 lb

## 2017-03-21 DIAGNOSIS — J301 Allergic rhinitis due to pollen: Secondary | ICD-10-CM | POA: Diagnosis not present

## 2017-03-21 DIAGNOSIS — R011 Cardiac murmur, unspecified: Secondary | ICD-10-CM

## 2017-03-21 DIAGNOSIS — J302 Other seasonal allergic rhinitis: Secondary | ICD-10-CM | POA: Insufficient documentation

## 2017-03-21 MED ORDER — FLUTICASONE PROPIONATE 50 MCG/ACT NA SUSP: 2.0000 | Freq: Every day | NASAL | 6 refills | Status: DC

## 2017-03-21 MED ORDER — ALBUTEROL SULFATE HFA 108 (90 BASE) MCG/ACT IN AERS: 1.0000 | INHALATION_SPRAY | Freq: Four times a day (QID) | RESPIRATORY_TRACT | 3 refills | Status: DC | PRN

## 2017-03-21 MED ORDER — CETIRIZINE HCL 10 MG PO TBDP: 10.0000 mg | ORAL_TABLET | Freq: Every day | ORAL | 3 refills | Status: DC | PRN

## 2017-03-21 NOTE — Patient Instructions (Addendum)
Have your lab results sent over to our office please.  Stop taking your lisinopril.  Find out about your pneumonia vaccine as well.

## 2017-03-21 NOTE — Progress Notes (Signed)
Pre visit review using our clinic review tool, if applicable. No additional management support is needed unless otherwise documented below in the visit note. 

## 2017-03-21 NOTE — Progress Notes (Signed)
Chief Complaint  Patient presents with  . Establish Care  . Medication Refill    albuterol and zyrtec       New Patient Visit SUBJECTIVE: HPI: Jay Hawkins is an 61 y.o.male who is being seen for establishing care.  Colonoscopy- [facility] scheduled this summer due to polyps PCV23- does believe he has received, will let us know if anything is changed  Pt has a hx of asthma and allergies. He is particularly allergic to pollen and has been suffering as of late. He normally takes Flonase and Zyrtec, requesting refills. He also has a hx of asthma, mild intermittent, well controlled on prn albuterol. He is also requesting a refill of this. Symptoms include itchy/watery eyes, some SOB, runny/stuffy nose, cough, post nasal drainage, and some ear fullness. Denies any fevers.  Hypertension Patient presents for hypertension follow up. He does monitor home blood pressures. Blood pressures ranging on average from 110-120's/70's. He is not compliant with medication- lisinopril 20 mg daily. Patient has these side effects of medication: none He is adhering to a healthy diet overall. Exercise: kickboxing, cardio   Allergies  Allergen Reactions  . Niacin And Related Itching    Past Medical History:  Diagnosis Date  . Allergy   . Asthma   . Colon polyps   . GERD (gastroesophageal reflux disease)   . Heart murmur   . Hyperlipidemia   . Hypertension   . Tendonitis    Past Surgical History:  Procedure Laterality Date  . LEFT HEART CATHETERIZATION WITH CORONARY ANGIOGRAM N/A 12/11/2014   Procedure: LEFT HEART CATHETERIZATION WITH CORONARY ANGIOGRAM;  Surgeon: Lennette Bihari, MD;  Location: [facility] CATH LAB;  Service: Cardiovascular;  Laterality: N/A;  . NASAL POLYP SURGERY    . TONSILLECTOMY     Social History   Social History  . Marital status: Married   Social History Main Topics  . Smoking status: Never Smoker  . Smokeless tobacco: Never Used  . Alcohol use 1.2 oz/week    2 Glasses of wine  per week  . Drug use: No   Family History  Problem Relation Age of Onset  . Cancer Mother   . Hypertension Mother   . Cancer Father      Current Outpatient Prescriptions:  .  albuterol (PROVENTIL HFA;VENTOLIN HFA) 108 (90 Base) MCG/ACT inhaler, Inhale 1-2 puffs into the lungs every 6 (six) hours as needed for wheezing or shortness of breath., Disp: 1 Inhaler, Rfl: 3 .  Cetirizine HCl (ZYRTEC ALLERGY) 10 MG TBDP, Take 10 mg by mouth daily as needed., Disp: 90 tablet, Rfl: 3 .  Multiple Vitamins-Minerals (THERA M PLUS PO), Take 1 tablet by mouth daily., Disp: , Rfl:  .  fluticasone (FLONASE) 50 MCG/ACT nasal spray, Place 2 sprays into both nostrils daily., Disp: 16 g, Rfl: 6  ROS HEENT: +runny/stuffy nose  Respiratory: Denies current dyspnea   OBJECTIVE: BP 138/64 (BP Location: Left Arm, Cuff Size: Normal)   Pulse 65   Temp 98.1 F (36.7 C) (Oral)   Resp 16   Ht  (1.702 m)   Wt 176 lb (79.8 kg)   SpO2 98%   BMI 27.57 kg/m   Constitutional: -  VS reviewed -  Well developed, well nourished, appears stated age -  No apparent distress  Psychiatric: -  Oriented to person, place, and time -  Memory intact -  Affect and mood normal -  Fluent conversation, good eye contact -  Judgment and insight age appropriate  Eye: -  Conjunctivae clear, no discharge -  Pupils symmetric, round, reactive to light  ENMT: -  Ears are patent b/l without erythema or discharge. TM's are shiny and clear b/l without evidence of effusion or infection. -  Nares are patent, turbinates enlarged, some rhinorrhea appreciated on L -  Oral mucosa without lesions, tongue and uvula midline    Tonsils not enlarged, no erythema, no exudate, trachea midline    Pharynx moist, no lesions, no erythema  Neck: -  No gross swelling, no palpable masses -  Thyroid midline, not enlarged, mobile, no palpable masses  Cardiovascular: -  RRR, 2/6 SEM heard loudest at mitral listening post -  +carotid bruits b/l -  No  LE edema  Respiratory: -  Normal respiratory effort, no accessory muscle use, no retraction -  Breath sounds equal, no wheezes, no ronchi, no crackles  Musculoskeletal: -  No clubbing, no cyanosis -  Gait normal  Skin: -  No significant lesion on inspection -  Warm and dry to palpation   ASSESSMENT/PLAN: Seasonal allergic rhinitis due to pollen - Plan: fluticasone (FLONASE) 50 MCG/ACT nasal spray, albuterol (PROVENTIL HFA;VENTOLIN HFA) 108 (90 Base) MCG/ACT inhaler, Cetirizine HCl (ZYRTEC ALLERGY) 10 MG TBDP  Heart murmur  Patient instructed to sign release of records form from his previous PCP. Reorder above. Patient has had extensive workup regarding his heart murmur, appears to be mild mitral regurgitation. He has also had carotid Dopplers done. Given his home readings and noncompliance, will stop lisinopril officially. Pt had labs done w/in past year at work, requested he have these sent to office. Patient should return in 6 mo or prn. The patient voiced understanding and agreement to the plan.   Jilda Roche [place], DO 03/21/17  4:40 PM

## 2017-03-30 NOTE — Telephone Encounter (Signed)
Done

## 2017-04-16 DIAGNOSIS — M47816 Spondylosis without myelopathy or radiculopathy, lumbar region: Secondary | ICD-10-CM | POA: Diagnosis not present

## 2017-04-16 DIAGNOSIS — M5416 Radiculopathy, lumbar region: Secondary | ICD-10-CM | POA: Diagnosis not present

## 2017-04-16 DIAGNOSIS — Z6824 Body mass index (BMI) 24.0-24.9, adult: Secondary | ICD-10-CM | POA: Diagnosis not present

## 2017-04-16 DIAGNOSIS — M5136 Other intervertebral disc degeneration, lumbar region: Secondary | ICD-10-CM | POA: Diagnosis not present

## 2017-05-01 DIAGNOSIS — M5416 Radiculopathy, lumbar region: Secondary | ICD-10-CM | POA: Diagnosis not present

## 2017-05-01 DIAGNOSIS — M5126 Other intervertebral disc displacement, lumbar region: Secondary | ICD-10-CM | POA: Diagnosis not present

## 2017-05-01 DIAGNOSIS — M5136 Other intervertebral disc degeneration, lumbar region: Secondary | ICD-10-CM | POA: Diagnosis not present

## 2017-05-01 DIAGNOSIS — M47816 Spondylosis without myelopathy or radiculopathy, lumbar region: Secondary | ICD-10-CM | POA: Diagnosis not present

## 2017-05-01 DIAGNOSIS — M4726 Other spondylosis with radiculopathy, lumbar region: Secondary | ICD-10-CM | POA: Diagnosis not present

## 2017-05-10 DIAGNOSIS — D2339 Other benign neoplasm of skin of other parts of face: Secondary | ICD-10-CM | POA: Diagnosis not present

## 2017-05-16 DIAGNOSIS — M5136 Other intervertebral disc degeneration, lumbar region: Secondary | ICD-10-CM | POA: Diagnosis not present

## 2017-05-16 DIAGNOSIS — Z6824 Body mass index (BMI) 24.0-24.9, adult: Secondary | ICD-10-CM | POA: Diagnosis not present

## 2017-05-16 DIAGNOSIS — M47816 Spondylosis without myelopathy or radiculopathy, lumbar region: Secondary | ICD-10-CM | POA: Diagnosis not present

## 2017-05-23 ENCOUNTER — Ambulatory Visit (HOSPITAL_BASED_OUTPATIENT_CLINIC_OR_DEPARTMENT_OTHER)
Admission: RE | Admit: 2017-05-23 | Discharge: 2017-05-23 | Disposition: A | Discharge status: Home / Self Care | Payer: BLUE CROSS/BLUE SHIELD | Source: Ambulatory Visit | Attending: Family Medicine | Admitting: Family Medicine

## 2017-05-23 ENCOUNTER — Ambulatory Visit (INDEPENDENT_AMBULATORY_CARE_PROVIDER_SITE_OTHER): Payer: BLUE CROSS/BLUE SHIELD | Admitting: Family Medicine

## 2017-05-23 ENCOUNTER — Encounter: Payer: Self-pay | Admitting: Family Medicine

## 2017-05-23 VITALS — BP 130/56 | HR 62 | Temp 98.1°F | Ht 67.0 in | Wt 176.2 lb

## 2017-05-23 DIAGNOSIS — R1013 Epigastric pain: Secondary | ICD-10-CM | POA: Diagnosis not present

## 2017-05-23 DIAGNOSIS — R918 Other nonspecific abnormal finding of lung field: Secondary | ICD-10-CM | POA: Diagnosis not present

## 2017-05-23 DIAGNOSIS — R1084 Generalized abdominal pain: Secondary | ICD-10-CM | POA: Diagnosis not present

## 2017-05-23 NOTE — Progress Notes (Signed)
Chief Complaint  Patient presents with  . Stomach issues    upper-gas, cramps,nausea-x 1 1/2 weeks    Jay Hawkins is here for abdominal pain.  Duration: 2 weeks Nighttime awakenings? Not anymore Bleeding? No Weight loss? No Palliation: Nothing Provocation: Nothing Associated symptoms: nausea, has not had a good BM in 2 days, has been hard and small Denies: fever, vomiting, inability to keep down fluids and diarrhea Treatment to date: Rolaids help intermittently but comes back Doesn't feel like reflux.  ROS: Constitutional: No fevers GI: No N/V, no bleeding + pain  Past Medical History:  Diagnosis Date  . Allergy   . Asthma   . Colon polyps   . GERD (gastroesophageal reflux disease)   . Heart murmur   . Hyperlipidemia   . Hypertension   . Tendonitis    Family History  Problem Relation Age of Onset  . Cancer Mother   . Hypertension Mother   . Cancer Father    Past Surgical History:  Procedure Laterality Date  . LEFT HEART CATHETERIZATION WITH CORONARY ANGIOGRAM N/A 12/11/2014   Procedure: LEFT HEART CATHETERIZATION WITH CORONARY ANGIOGRAM;  Surgeon: Lennette Biharihomas A Kelly, MD;  [facility];  Service: Cardiovascular;  Laterality: N/A;  . NASAL POLYP SURGERY    . TONSILLECTOMY      BP (!) 130/56 (BP Location: Left Arm, Patient Position: Sitting, Cuff Size: Normal)   Pulse 62   Temp 98.1 F (36.7 C) (Oral)   Ht 5\' 7"  (1.702 m)   Wt 176 lb 3.2 oz (79.9 kg)   SpO2 98%   BMI 27.60 kg/m  Gen.: Awake, alert, appears stated age HEENT: Mucous membranes moist without mucosal lesions Heart: Regular rate and rhythm without murmurs Lungs: Clear auscultation bilaterally, no rales or wheezing, normal effort without accessory muscle use. Abdomen: Bowel sounds are present. Abdomen is soft, TTP diffusely in distribution of large colon, nondistended, no masses or organomegaly. Negative Murphy's, Rovsing's, McBurney's, and Carnett's sign. Psych: Age appropriate judgment and  insight. Normal mood and affect.  Generalized abdominal pain - Plan: DG Abd 2 Views  Orders as above. XR shows stool and some gas as we expected. Called pt to inform him of results. MiraLAX daily for the next 3 days followed by an enema if there is no improvement. Let us know if there are any new symptoms. F/u prn. Pt voiced understanding and agreement to the plan.  Jay Hawkins Jay JolivueWendling, DO 05/23/17 4:29 PM

## 2017-05-23 NOTE — Patient Instructions (Addendum)
Miralax daily for 3 days. If no improvement/good bowel movement, use an enema (mineral oil, soap sud, Fleet's).  If you start having fevers, bleeding, unintentional weight loss, or any other new symptom, let us know or seek care.

## 2017-07-12 DIAGNOSIS — Z86010 Personal history of colonic polyps: Secondary | ICD-10-CM | POA: Diagnosis not present

## 2017-07-12 DIAGNOSIS — K621 Rectal polyp: Secondary | ICD-10-CM | POA: Diagnosis not present

## 2017-07-12 DIAGNOSIS — K5730 Diverticulosis of large intestine without perforation or abscess without bleeding: Secondary | ICD-10-CM | POA: Diagnosis not present

## 2017-07-12 DIAGNOSIS — K228 Other specified diseases of esophagus: Secondary | ICD-10-CM | POA: Diagnosis not present

## 2017-07-12 DIAGNOSIS — Z800 Family history of malignant neoplasm of digestive organs: Secondary | ICD-10-CM | POA: Diagnosis not present

## 2017-07-12 DIAGNOSIS — Z1211 Encounter for screening for malignant neoplasm of colon: Secondary | ICD-10-CM | POA: Diagnosis not present

## 2017-07-12 DIAGNOSIS — K629 Disease of anus and rectum, unspecified: Secondary | ICD-10-CM | POA: Diagnosis not present

## 2017-07-12 DIAGNOSIS — R1310 Dysphagia, unspecified: Secondary | ICD-10-CM | POA: Diagnosis not present

## 2017-07-12 DIAGNOSIS — R12 Heartburn: Secondary | ICD-10-CM | POA: Diagnosis not present

## 2017-07-12 DIAGNOSIS — K2930 Chronic superficial gastritis without bleeding: Secondary | ICD-10-CM | POA: Diagnosis not present

## 2017-07-12 DIAGNOSIS — K6389 Other specified diseases of intestine: Secondary | ICD-10-CM | POA: Diagnosis not present

## 2017-07-12 DIAGNOSIS — K648 Other hemorrhoids: Secondary | ICD-10-CM | POA: Diagnosis not present

## 2017-07-12 DIAGNOSIS — K2950 Unspecified chronic gastritis without bleeding: Secondary | ICD-10-CM | POA: Diagnosis not present

## 2017-10-19 DIAGNOSIS — R002 Palpitations: Secondary | ICD-10-CM | POA: Diagnosis not present

## 2017-10-29 DIAGNOSIS — R002 Palpitations: Secondary | ICD-10-CM | POA: Diagnosis not present

## 2017-11-16 DIAGNOSIS — R002 Palpitations: Secondary | ICD-10-CM | POA: Diagnosis not present

## 2018-01-28 ENCOUNTER — Encounter: Payer: Self-pay | Admitting: Family Medicine

## 2018-01-28 ENCOUNTER — Ambulatory Visit (INDEPENDENT_AMBULATORY_CARE_PROVIDER_SITE_OTHER): Payer: BLUE CROSS/BLUE SHIELD | Admitting: Family Medicine

## 2018-01-28 ENCOUNTER — Telehealth: Payer: Self-pay | Admitting: Family Medicine

## 2018-01-28 VITALS — BP 118/68 | HR 69 | Temp 98.0°F | Ht 67.0 in | Wt 177.2 lb

## 2018-01-28 DIAGNOSIS — J301 Allergic rhinitis due to pollen: Secondary | ICD-10-CM | POA: Diagnosis not present

## 2018-01-28 DIAGNOSIS — Z125 Encounter for screening for malignant neoplasm of prostate: Secondary | ICD-10-CM

## 2018-01-28 DIAGNOSIS — Z0000 Encounter for general adult medical examination without abnormal findings: Secondary | ICD-10-CM | POA: Diagnosis not present

## 2018-01-28 DIAGNOSIS — Z114 Encounter for screening for human immunodeficiency virus [HIV]: Secondary | ICD-10-CM | POA: Diagnosis not present

## 2018-01-28 DIAGNOSIS — Z23 Encounter for immunization: Secondary | ICD-10-CM

## 2018-01-28 DIAGNOSIS — Z1159 Encounter for screening for other viral diseases: Secondary | ICD-10-CM | POA: Diagnosis not present

## 2018-01-28 DIAGNOSIS — H9192 Unspecified hearing loss, left ear: Secondary | ICD-10-CM

## 2018-01-28 LAB — COMPREHENSIVE METABOLIC PANEL
ALT: 19 U/L (ref 0–53)
AST: 23 U/L (ref 0–37)
Albumin: 4 g/dL (ref 3.5–5.2)
Alkaline Phosphatase: 46 U/L (ref 39–117)
BILIRUBIN TOTAL: 0.7 mg/dL (ref 0.2–1.2)
BUN: 15 mg/dL (ref 6–23)
CALCIUM: 9.1 mg/dL (ref 8.4–10.5)
CHLORIDE: 105 meq/L (ref 96–112)
CO2: 26 mEq/L (ref 19–32)
CREATININE: 1.11 mg/dL (ref 0.40–1.50)
GFR: 71.61 mL/min (ref >60.00)
Glucose, Bld: 99 mg/dL (ref 70–99)
Potassium: 3.5 mEq/L (ref 3.5–5.1)
Sodium: 139 mEq/L (ref 135–145)
Total Protein: 7.1 g/dL (ref 6.0–8.3)

## 2018-01-28 LAB — LIPID PANEL
CHOL/HDL RATIO: 5
Cholesterol: 155 mg/dL (ref 0–200)
HDL: 30.6 mg/dL — ABNORMAL LOW (ref >39.00)
LDL Cholesterol: 106 mg/dL — ABNORMAL HIGH (ref 0–99)
NonHDL: 124.52
Triglycerides: 95 mg/dL (ref 0.0–149.0)
VLDL: 19 mg/dL (ref 0.0–40.0)

## 2018-01-28 LAB — CBC
HCT: 44.9 % (ref 39.0–52.0)
Hemoglobin: 15.5 g/dL (ref 13.0–17.0)
MCHC: 34.5 g/dL (ref 30.0–36.0)
MCV: 88.4 fl (ref 78.0–100.0)
PLATELETS: 233 10*3/uL (ref 150.0–400.0)
RBC: 5.08 Mil/uL (ref 4.22–5.81)
RDW: 13.4 % (ref 11.5–15.5)
WBC: 5.6 10*3/uL (ref 4.0–10.5)

## 2018-01-28 LAB — PSA: PSA: 1.47 ng/mL (ref 0.10–4.00)

## 2018-01-28 MED ORDER — TRAMADOL HCL 50 MG PO TABS: 50.0000 mg | ORAL_TABLET | Freq: Two times a day (BID) | ORAL | 0 refills | Status: DC | PRN

## 2018-01-28 MED ORDER — CETIRIZINE HCL 10 MG PO TBDP: 10.0000 mg | ORAL_TABLET | Freq: Every day | ORAL | 3 refills | Status: DC | PRN

## 2018-01-28 MED ORDER — FLUTICASONE PROPIONATE 50 MCG/ACT NA SUSP
2.0000 | Freq: Every day | NASAL | 6 refills | Status: DC
Start: 2018-01-28 — End: 2019-03-01

## 2018-01-28 NOTE — Telephone Encounter (Signed)
Pharmacist informed of diagnosis.

## 2018-01-28 NOTE — Progress Notes (Signed)
Chief Complaint  Patient presents with  . Annual Exam    Well Male Jay Hawkins is here for a complete physical.   His last physical was >1 year ago.  Current diet: in general, a "healthy" diet   Current exercise: walking, lifts some weights Weight trend: stable Does pt snore? No. Daytime fatigue? No. Seat belt? Yes.    Health maintenance Colonoscopy- Yes  Tetanus- No HIV- No Hep C- No Prostate cancer screening- No   Past Medical History:  Diagnosis Date  . Allergy   . Asthma   . Colon polyps   . GERD (gastroesophageal reflux disease)   . Heart murmur   . Hyperlipidemia   . Hypertension   . Tendonitis     Past Surgical History:  Procedure Laterality Date  . LEFT HEART CATHETERIZATION WITH CORONARY ANGIOGRAM N/A 12/11/2014   Procedure: LEFT HEART CATHETERIZATION WITH CORONARY ANGIOGRAM;  Surgeon: Jay Bihari, MD;  Location: [facility] CATH LAB;  Service: Cardiovascular;  Laterality: N/A;  . NASAL POLYP SURGERY    . TONSILLECTOMY     Medications  Current Outpatient Medications on File Prior to Visit  Medication Sig Dispense Refill  . albuterol (PROVENTIL HFA;VENTOLIN HFA) 108 (90 Base) MCG/ACT inhaler Inhale 1-2 puffs into the lungs every 6 (six) hours as needed for wheezing or shortness of breath. 1 Inhaler 3  . Multiple Vitamins-Minerals (THERA M PLUS PO) Take 1 tablet by mouth daily.    . traMADol (ULTRAM) 50 MG tablet Take 50 mg by mouth every 6 (six) hours as needed.      Allergies Allergies  Allergen Reactions  . Niacin And Related Itching   Family History Family History  Problem Relation Age of Onset  . Cancer Mother   . Hypertension Mother   . Cancer Father     Review of Systems: Constitutional:  no fevers or chills Eye:  no recent significant change in vision Ear/Nose/Mouth/Throat:  Ears:  +L sided hearing loss Nose/Mouth/Throat:  no complaints of nasal congestion or bleeding, no sore throat Cardiovascular:  no current chest pain, no  palpitations Respiratory:  no cough  Gastrointestinal:  no abdominal pain, no change in bowel habits, no nausea, vomiting, diarrhea, or constipation and no black or bloody stool GU:  Male: negative for dysuria, frequency, and incontinence and negative for prostate symptoms Musculoskeletal/Extremities: will sometimes have LBP; otherwise no pain, redness, or swelling of the joints Integumentary (Skin/Breast):  no abnormal skin lesions reported Neurologic:  no headaches Endocrine: No unexpected weight changes Hematologic/Lymphatic:  no abnormal bleeding  Exam BP 118/68 (BP Location: Left Arm, Patient Position: Sitting, Cuff Size: Normal)   Pulse 69   Temp 98 F (36.7 C) (Oral)   Ht 5\' 7"  (1.702 m)   Wt 177 lb 4 oz (80.4 kg)   SpO2 98%   BMI 27.76 kg/m  General:  well developed, well nourished, in no apparent distress Skin:  no significant moles, warts, or growths Head:  no masses, lesions, or tenderness Eyes:  pupils equal and round, sclera anicteric without injection Ears:  canals without lesions, TMs shiny without retraction, no obvious effusion, no erythema Nose:  nares patent, septum midline, mucosa normal Throat/Pharynx:  lips and gingiva without lesion; tongue and uvula midline; non-inflamed pharynx; no exudates or postnasal drainage Neck: neck supple without adenopathy, thyromegaly, or masses Lungs:  clear to auscultation, breath sounds equal bilaterally, no respiratory distress Cardio:  regular rate and rhythm without murmurs, heart sounds without clicks or rubs Abdomen:  abdomen soft,  nontender; bowel sounds normal; no masses or organomegaly Genital (male): circumcised penis, no lesions or discharge; testes present bilaterally without masses or tenderness Rectal: Deferred Musculoskeletal:  symmetrical muscle groups noted without atrophy or deformity Extremities:  no clubbing, cyanosis, or edema, no deformities, no skin discoloration Neuro:  gait normal; deep tendon reflexes  normal and symmetric Psych: well oriented with normal range of affect and appropriate judgment/insight  Assessment and Plan  Well adult exam - Plan: Lipid panel, CBC, Comprehensive metabolic panel  Seasonal allergic rhinitis due to pollen - Plan: fluticasone (FLONASE) 50 MCG/ACT nasal spray, Cetirizine HCl (ZYRTEC ALLERGY) 10 MG TBDP  Hearing loss of left ear, unspecified hearing loss type - Plan: Ambulatory referral to Audiology  Screening for HIV (human immunodeficiency virus) - Plan: HIV antibody  Encounter for hepatitis C screening test for low risk patient - Plan: Hepatitis C antibody  Screening for malignant neoplasm of prostate - Plan: PSA   [name]. Counseled on diet and exercise. Counseled on risks and benefits of prostate cancer screening with PSA. The patient agrees to undergo testing. Refer to audiology for L hearing loss.  Immunizations, labs, and further orders as above. Follow up 6 mo. The patient voiced understanding and agreement to the plan.  Jilda Rocheicholas Paul Ar[place]end[place], DO 01/28/18 8:40 AM

## 2018-01-28 NOTE — Progress Notes (Signed)
Pre visit review using our clinic review tool, if applicable. No additional management support is needed unless otherwise documented below in the visit note. 

## 2018-01-28 NOTE — Addendum Note (Signed)
Addended by: Scharlene GlossEWING, ROBIN B on: 01/28/2018 09:17 AM   Modules accepted: Orders

## 2018-01-28 NOTE — Telephone Encounter (Signed)
Copied from CRM [phone]. Topic: Quick Communication - See Telephone Encounter >> Jan 28, 2018  9:45 AM Eston Mouldavis, Temia Debroux B wrote: CRM for notification. See Telephone encounter for:  [facility] for the pts tramadol rx.   She needs to know if its for acute, chronic or surgical. Marsha's contact number is [phone] 01/28/18.

## 2018-01-28 NOTE — Patient Instructions (Signed)
Keep up the good work.  1-2 days to get the results of your labs to you.  Let us know if you need anything.

## 2018-01-28 NOTE — Telephone Encounter (Signed)
Chronic low back pain. TY.

## 2018-01-29 ENCOUNTER — Telehealth: Payer: Self-pay | Admitting: *Deleted

## 2018-01-29 LAB — HIV ANTIBODY (ROUTINE TESTING W REFLEX): HIV 1&2 Ab, 4th Generation: NONREACTIVE

## 2018-01-29 LAB — HEPATITIS C ANTIBODY
Hepatitis C Ab: NONREACTIVE
SIGNAL TO CUT-OFF: 0.06 (ref <1.00)

## 2018-01-29 NOTE — Telephone Encounter (Signed)
Results mailed.  Copied from CRM [phone]. Topic: Quick Communication - Lab Results >> Jan 29, 2018 10:58 AM Oneal GroutSebastian, Jennifer S wrote: Would like a copy of lab results mailed to him. Please advise

## 2018-02-13 DIAGNOSIS — H903 Sensorineural hearing loss, bilateral: Secondary | ICD-10-CM | POA: Diagnosis not present

## 2018-07-29 ENCOUNTER — Ambulatory Visit: Payer: BLUE CROSS/BLUE SHIELD | Admitting: Family Medicine

## 2018-07-29 ENCOUNTER — Encounter: Payer: Self-pay | Admitting: Family Medicine

## 2018-07-29 VITALS — BP 108/72 | HR 56 | Temp 98.2°F | Ht 67.0 in | Wt 178.0 lb

## 2018-07-29 DIAGNOSIS — R918 Other nonspecific abnormal finding of lung field: Secondary | ICD-10-CM | POA: Diagnosis not present

## 2018-07-29 DIAGNOSIS — M545 Low back pain, unspecified: Secondary | ICD-10-CM | POA: Insufficient documentation

## 2018-07-29 DIAGNOSIS — G8929 Other chronic pain: Secondary | ICD-10-CM

## 2018-07-29 MED ORDER — TRAMADOL HCL 50 MG PO TABS: 50.0000 mg | ORAL_TABLET | Freq: Two times a day (BID) | ORAL | 1 refills | Status: DC | PRN

## 2018-07-29 NOTE — Progress Notes (Signed)
Chief Complaint  Patient presents with  . Follow-up    back problems    Subjective: Patient is a 62 y.o. male here for chronic back pain.  This comes and goes throughout the course of the year.  Tramadol helps the most.  He also uses ibuprofen and Tylenol.  He was given some stretches and exercises that he does when his back flares.  These are helpful.  He also has a history of a pulmonary nodule found incidentally on CT scan in 2013.  This was never followed up on.  He has been having shortness of breath on exertion.  He is working with his cardiologist with this as well.  He does have mild intermittent asthma that flares with pollen and ragweed.  ROS: Heart: Denies chest pain  Lungs: Denies current SOB   Past Medical History:  Diagnosis Date  . Allergy   . Asthma   . Colon polyps   . GERD (gastroesophageal reflux disease)   . Heart murmur   . Hyperlipidemia   . Hypertension   . Tendonitis     Objective: BP 108/72 (BP Location: Left Arm, Patient Position: Sitting, Cuff Size: Normal)   Pulse (!) 56   Temp 98.2 F (36.8 C) (Oral)   Ht 5\' 7"  (1.702 m)   Wt 178 lb (80.7 kg)   SpO2 97%   BMI 27.88 kg/m  General: Awake, appears stated age HEENT: MMM, EOMi Heart: RRR, no murmurs Lungs: CTAB, no rales, wheezes or rhonchi. No accessory muscle use MSK: Mild ttp over L lumbar paraspinal musculature; negative straight leg bilaterally, tight [place] bilaterally Neuro: DTRs equal and symmetric bilaterally, no clonus, no cerebellar signs Psych: Age appropriate judgment and insight, normal affect and mood  Assessment and Plan: Chronic low back pain without sciatica, unspecified back pain laterality  Pulmonary nodules/lesions, multiple - Plan: Basic metabolic panel  Abnormal findings on diagnostic imaging of lung - Plan: CT Chest W Contrast, Basic metabolic panel  Continue tramadol.  Controlled substance contract signed today.  He uses very intermittently.  Heat, stretches,  ibuprofen, Tylenol as first options. Will order CT chest with contrast to follow-up on pulmonary nodules.  Check BMP today for renal function. Follow-up in 6 months for a physical or sooner if needed. The patient voiced understanding and agreement to the plan.  Jilda Rocheicholas Paul [place], DO 07/29/18  4:42 PM

## 2018-07-29 NOTE — Patient Instructions (Addendum)
Heat (pad or rice pillow in microwave) over affected area, 10-15 minutes twice daily.   OK to take Tylenol 1000 mg (2 extra strength tabs) or 975 mg (3 regular strength tabs) every 6 hours as needed.  Ibuprofen 400-600 mg (2-3 over the counter strength tabs) every 6 hours as needed for pain.  We will be in touch regarding your CT results. If you Islam't hear anything regarding your CT scan in the next couple days, let us know.  Let us know if you need anything.

## 2018-07-29 NOTE — Progress Notes (Signed)
Pre visit review using our clinic review tool, if applicable. No additional management support is needed unless otherwise documented below in the visit note. 

## 2018-07-30 LAB — BASIC METABOLIC PANEL
BUN: 18 mg/dL (ref 6–23)
CHLORIDE: 102 meq/L (ref 96–112)
CO2: 30 mEq/L (ref 19–32)
CREATININE: 1.35 mg/dL (ref 0.40–1.50)
Calcium: 9.2 mg/dL (ref 8.4–10.5)
GFR: 57.04 mL/min — ABNORMAL LOW (ref >60.00)
Glucose, Bld: 93 mg/dL (ref 70–99)
Potassium: 4.1 mEq/L (ref 3.5–5.1)
Sodium: 138 mEq/L (ref 135–145)

## 2018-08-02 ENCOUNTER — Encounter (HOSPITAL_BASED_OUTPATIENT_CLINIC_OR_DEPARTMENT_OTHER): Payer: Self-pay

## 2018-08-02 ENCOUNTER — Ambulatory Visit (HOSPITAL_BASED_OUTPATIENT_CLINIC_OR_DEPARTMENT_OTHER)
Admission: RE | Admit: 2018-08-02 | Discharge: 2018-08-02 | Disposition: A | Discharge status: Home / Self Care | Payer: BLUE CROSS/BLUE SHIELD | Source: Ambulatory Visit | Attending: Family Medicine | Admitting: Family Medicine

## 2018-08-02 DIAGNOSIS — J479 Bronchiectasis, uncomplicated: Secondary | ICD-10-CM | POA: Diagnosis not present

## 2018-08-02 DIAGNOSIS — R918 Other nonspecific abnormal finding of lung field: Secondary | ICD-10-CM | POA: Diagnosis not present

## 2018-08-02 DIAGNOSIS — I700 Atherosclerosis of aorta: Secondary | ICD-10-CM | POA: Diagnosis not present

## 2018-08-02 MED ORDER — IOPAMIDOL (ISOVUE-300) INJECTION 61%
100.0000 mL | Freq: Once | INTRAVENOUS | Status: AC | PRN
  Administered 2018-08-02: 80 mL via INTRAVENOUS

## 2018-08-06 ENCOUNTER — Encounter: Payer: Self-pay | Admitting: Family Medicine

## 2018-08-06 DIAGNOSIS — J301 Allergic rhinitis due to pollen: Secondary | ICD-10-CM

## 2018-08-08 MED ORDER — ALBUTEROL SULFATE HFA 108 (90 BASE) MCG/ACT IN AERS: 1.0000 | INHALATION_SPRAY | Freq: Four times a day (QID) | RESPIRATORY_TRACT | 3 refills | Status: DC | PRN

## 2018-09-03 DIAGNOSIS — M47816 Spondylosis without myelopathy or radiculopathy, lumbar region: Secondary | ICD-10-CM | POA: Diagnosis not present

## 2018-09-03 DIAGNOSIS — M5136 Other intervertebral disc degeneration, lumbar region: Secondary | ICD-10-CM | POA: Diagnosis not present

## 2018-09-03 DIAGNOSIS — Z79891 Long term (current) use of opiate analgesic: Secondary | ICD-10-CM | POA: Diagnosis not present

## 2018-10-18 DIAGNOSIS — J339 Nasal polyp, unspecified: Secondary | ICD-10-CM | POA: Diagnosis not present

## 2018-10-18 DIAGNOSIS — J320 Chronic maxillary sinusitis: Secondary | ICD-10-CM | POA: Diagnosis not present

## 2018-10-18 DIAGNOSIS — R430 Anosmia: Secondary | ICD-10-CM | POA: Diagnosis not present

## 2018-11-06 DIAGNOSIS — Z87891 Personal history of nicotine dependence: Secondary | ICD-10-CM | POA: Diagnosis not present

## 2018-11-06 DIAGNOSIS — J45909 Unspecified asthma, uncomplicated: Secondary | ICD-10-CM | POA: Diagnosis not present

## 2018-11-06 DIAGNOSIS — I350 Nonrheumatic aortic (valve) stenosis: Secondary | ICD-10-CM | POA: Diagnosis not present

## 2018-11-06 DIAGNOSIS — R002 Palpitations: Secondary | ICD-10-CM | POA: Diagnosis not present

## 2018-11-08 DIAGNOSIS — Z131 Encounter for screening for diabetes mellitus: Secondary | ICD-10-CM | POA: Diagnosis not present

## 2018-11-08 DIAGNOSIS — M549 Dorsalgia, unspecified: Secondary | ICD-10-CM | POA: Diagnosis not present

## 2018-11-08 DIAGNOSIS — I519 Heart disease, unspecified: Secondary | ICD-10-CM | POA: Diagnosis not present

## 2018-11-15 DIAGNOSIS — I080 Rheumatic disorders of both mitral and aortic valves: Secondary | ICD-10-CM | POA: Diagnosis not present

## 2018-11-15 DIAGNOSIS — I358 Other nonrheumatic aortic valve disorders: Secondary | ICD-10-CM | POA: Diagnosis not present

## 2018-11-15 DIAGNOSIS — I350 Nonrheumatic aortic (valve) stenosis: Secondary | ICD-10-CM | POA: Diagnosis not present

## 2018-12-12 DIAGNOSIS — J45909 Unspecified asthma, uncomplicated: Secondary | ICD-10-CM | POA: Diagnosis not present

## 2019-01-10 DIAGNOSIS — J45909 Unspecified asthma, uncomplicated: Secondary | ICD-10-CM | POA: Diagnosis not present

## 2019-01-10 DIAGNOSIS — J479 Bronchiectasis, uncomplicated: Secondary | ICD-10-CM | POA: Diagnosis not present

## 2019-01-30 DIAGNOSIS — J984 Other disorders of lung: Secondary | ICD-10-CM | POA: Diagnosis not present

## 2019-01-30 DIAGNOSIS — Z87891 Personal history of nicotine dependence: Secondary | ICD-10-CM | POA: Diagnosis not present

## 2019-01-31 ENCOUNTER — Encounter: Payer: BLUE CROSS/BLUE SHIELD | Admitting: Family Medicine

## 2019-02-07 ENCOUNTER — Encounter: Payer: BLUE CROSS/BLUE SHIELD | Admitting: Family Medicine

## 2019-02-18 DIAGNOSIS — J320 Chronic maxillary sinusitis: Secondary | ICD-10-CM | POA: Diagnosis not present

## 2019-02-18 DIAGNOSIS — R430 Anosmia: Secondary | ICD-10-CM | POA: Diagnosis not present

## 2019-02-18 DIAGNOSIS — J339 Nasal polyp, unspecified: Secondary | ICD-10-CM | POA: Diagnosis not present

## 2019-03-01 ENCOUNTER — Other Ambulatory Visit: Payer: Self-pay | Admitting: Family Medicine

## 2019-03-01 DIAGNOSIS — J301 Allergic rhinitis due to pollen: Secondary | ICD-10-CM

## 2019-03-01 MED ORDER — FLUTICASONE PROPIONATE 50 MCG/ACT NA SUSP: 2.0000 | Freq: Every day | NASAL | 0 refills | Status: AC

## 2019-03-01 MED ORDER — CETIRIZINE HCL 10 MG PO TBDP: 10.0000 mg | ORAL_TABLET | Freq: Every day | ORAL | 0 refills | Status: AC | PRN

## 2019-03-01 NOTE — Telephone Encounter (Signed)
Last prescribed by Dr. Patsy Lager, this is a Jay Hawkins pt

## 2019-03-03 MED ORDER — ALBUTEROL SULFATE HFA 108 (90 BASE) MCG/ACT IN AERS: 1.0000 | INHALATION_SPRAY | Freq: Four times a day (QID) | RESPIRATORY_TRACT | 0 refills | Status: DC | PRN

## 2019-04-29 ENCOUNTER — Ambulatory Visit (INDEPENDENT_AMBULATORY_CARE_PROVIDER_SITE_OTHER): Payer: BLUE CROSS/BLUE SHIELD | Admitting: Family Medicine

## 2019-04-29 ENCOUNTER — Encounter: Payer: Self-pay | Admitting: Family Medicine

## 2019-04-29 ENCOUNTER — Other Ambulatory Visit: Payer: Self-pay

## 2019-04-29 DIAGNOSIS — R361 Hematospermia: Secondary | ICD-10-CM

## 2019-04-29 NOTE — Progress Notes (Signed)
Chief Complaint  Patient presents with  . Follow-up    Subjective: Patient is a 63 y.o. male here for blood in sperm. Due to COVID-19 pandemic, we are interacting via web portal for an electronic face-to-face visit. I verified patient's ID using 2 identifiers. Patient agreed to proceed with visit via this method. Patient is at home, I am at home. Patient and I are present for visit.   2 d ago, noticed blood in sperm. No pain or urinary complaints. No new sexual partners, injury, fevers, blood in urine, weight loss. Has never happened before. Sexually active 1-2x/week with wife. No insertive anal intercourse, he is circumcised.   ROS: GU: No blood in urine  Past Medical History:  Diagnosis Date  . Allergy   . Asthma   . Colon polyps   . GERD (gastroesophageal reflux disease)   . Heart murmur   . Hyperlipidemia   . Hypertension   . Tendonitis     Objective: No conversational dyspnea Age appropriate judgment and insight Nml affect and mood  Assessment and Plan: Hematospermia  Reassurance given for now. Will ck UA in future. I will see him in a month for his CPE, we can take care of that test then.  The patient voiced understanding and agreement to the plan.  Jilda Roche [place], DO 04/29/19  10:31 AM

## 2019-05-26 ENCOUNTER — Other Ambulatory Visit: Payer: Self-pay

## 2019-05-26 ENCOUNTER — Encounter: Payer: Self-pay | Admitting: Family Medicine

## 2019-05-26 ENCOUNTER — Ambulatory Visit (INDEPENDENT_AMBULATORY_CARE_PROVIDER_SITE_OTHER): Payer: BC Managed Care – PPO | Admitting: Family Medicine

## 2019-05-26 VITALS — BP 120/78 | HR 63 | Temp 98.6°F | Ht 67.0 in | Wt 175.0 lb

## 2019-05-26 DIAGNOSIS — Z125 Encounter for screening for malignant neoplasm of prostate: Secondary | ICD-10-CM

## 2019-05-26 DIAGNOSIS — Z0000 Encounter for general adult medical examination without abnormal findings: Secondary | ICD-10-CM

## 2019-05-26 DIAGNOSIS — Z23 Encounter for immunization: Secondary | ICD-10-CM | POA: Diagnosis not present

## 2019-05-26 LAB — CBC
HCT: 45.2 % (ref 39.0–52.0)
Hemoglobin: 15.1 g/dL (ref 13.0–17.0)
MCHC: 33.5 g/dL (ref 30.0–36.0)
MCV: 90 fl (ref 78.0–100.0)
Platelets: 244 10*3/uL (ref 150.0–400.0)
RBC: 5.03 Mil/uL (ref 4.22–5.81)
RDW: 13.4 % (ref 11.5–15.5)
WBC: 8.1 10*3/uL (ref 4.0–10.5)

## 2019-05-26 LAB — PSA: PSA: 1.15 ng/mL (ref 0.10–4.00)

## 2019-05-26 LAB — URINALYSIS, ROUTINE W REFLEX MICROSCOPIC
Bilirubin Urine: NEGATIVE
Leukocytes,Ua: NEGATIVE
Nitrite: NEGATIVE
Specific Gravity, Urine: 1.025 (ref 1.000–1.030)
Total Protein, Urine: NEGATIVE
Urine Glucose: NEGATIVE
Urobilinogen, UA: 0.2 (ref 0.0–1.0)
pH: 6 (ref 5.0–8.0)

## 2019-05-26 LAB — COMPREHENSIVE METABOLIC PANEL
ALT: 17 U/L (ref 0–53)
AST: 19 U/L (ref 0–37)
Albumin: 4 g/dL (ref 3.5–5.2)
Alkaline Phosphatase: 52 U/L (ref 39–117)
BUN: 15 mg/dL (ref 6–23)
CO2: 31 mEq/L (ref 19–32)
Calcium: 9.3 mg/dL (ref 8.4–10.5)
Chloride: 104 mEq/L (ref 96–112)
Creatinine, Ser: 1.27 mg/dL (ref 0.40–1.50)
GFR: 57.43 mL/min — ABNORMAL LOW (ref >60.00)
Glucose, Bld: 81 mg/dL (ref 70–99)
Potassium: 4.7 mEq/L (ref 3.5–5.1)
Sodium: 139 mEq/L (ref 135–145)
Total Bilirubin: 0.4 mg/dL (ref 0.2–1.2)
Total Protein: 6.5 g/dL (ref 6.0–8.3)

## 2019-05-26 LAB — LDL CHOLESTEROL, DIRECT: Direct LDL: 99 mg/dL

## 2019-05-26 LAB — LIPID PANEL
Cholesterol: 155 mg/dL (ref 0–200)
HDL: 33.2 mg/dL — ABNORMAL LOW (ref >39.00)
NonHDL: 121.56
Total CHOL/HDL Ratio: 5
Triglycerides: 242 mg/dL — ABNORMAL HIGH (ref 0.0–149.0)
VLDL: 48.4 mg/dL — ABNORMAL HIGH (ref 0.0–40.0)

## 2019-05-26 NOTE — Addendum Note (Signed)
Addended by: Sharon Seller B on: 05/26/2019 01:14 PM   Modules accepted: Orders

## 2019-05-26 NOTE — Patient Instructions (Signed)
Give us 2-3 business days to get the results of your labs back.   Keep the diet clean and stay active.  Let us know if you need anything. 

## 2019-05-26 NOTE — Progress Notes (Addendum)
Chief Complaint  Patient presents with  . Annual Exam    Well Male Jay Hawkins is here for a complete physical.   His last physical was >1 year ago.  Current diet: in general, a "healthy" diet.  Current exercise: wt resistance, cycling Weight trend: stable Daytime fatigue? No. Seat belt? Yes.    Health maintenance Shingrix- No Colonoscopy- Yes Tetanus- Yes HIV- Yes Hep C- Yes Prostate cancer screening- Yes   Past Medical History:  Diagnosis Date  . Allergy   . Asthma   . Colon polyps   . GERD (gastroesophageal reflux disease)   . Heart murmur   . Hyperlipidemia   . Hypertension   . Tendonitis       Past Surgical History:  Procedure Laterality Date  . LEFT HEART CATHETERIZATION WITH CORONARY ANGIOGRAM N/A 12/11/2014   Procedure: LEFT HEART CATHETERIZATION WITH CORONARY ANGIOGRAM;  Surgeon: Troy Sine, MD;  Location: [facility] CATH LAB;  Service: Cardiovascular;  Laterality: N/A;  . NASAL POLYP SURGERY    . TONSILLECTOMY      Medications  Current Outpatient Medications on File Prior to Visit  Medication Sig Dispense Refill  . albuterol (PROVENTIL HFA;VENTOLIN HFA) 108 (90 Base) MCG/ACT inhaler Inhale 1-2 puffs into the lungs every 6 (six) hours as needed for wheezing or shortness of breath. 1 Inhaler 0  . Cetirizine HCl (ZYRTEC ALLERGY) 10 MG TBDP Take 10 mg by mouth daily as needed. 30 tablet 0  . fluticasone (FLONASE) 50 MCG/ACT nasal spray Place 2 sprays into both nostrils daily. 16 g 0  . Multiple Vitamins-Minerals (THERA M PLUS PO) Take 1 tablet by mouth daily.    . traMADol (ULTRAM) 50 MG tablet Take 1 tablet (50 mg total) by mouth every 12 (twelve) hours as needed. 30 tablet 1   Allergies Allergies  Allergen Reactions  . Niacin And Related Itching    Family History Family History  Problem Relation Age of Onset  . Cancer Mother   . Hypertension Mother   . Cancer Father     Review of Systems: Constitutional:  no fevers Eye:  no recent significant  change in vision Ear/Nose/Mouth/Throat:  Ears:  no hearing loss Nose/Mouth/Throat:  no complaints of nasal congestion, no sore throat Cardiovascular:  no chest pain, no palpitations Respiratory:  no cough and no shortness of breath Gastrointestinal:  no abdominal pain, no change in bowel habits GU:  Male: negative for dysuria, frequency, and incontinence and negative for prostate symptoms Musculoskeletal/Extremities:  no pain, redness, or swelling of the joints Integumentary (Skin/Breast):  no abnormal skin lesions reported Neurologic:  no headaches Endocrine: No unexpected weight changes Hematologic/Lymphatic:  no abnormal bleeding  Exam BP 120/78 (BP Location: Left Arm, Patient Position: Sitting, Cuff Size: Normal)   Pulse 63   Temp 98.6 F (37 C) (Oral)   Ht 5\' 7"  (1.702 m)   Wt 175 lb (79.4 kg)   SpO2 95%   BMI 27.41 kg/m  General:  well developed, well nourished, in no apparent distress Skin:  no significant moles, warts, or growths Head:  no masses, lesions, or tenderness Eyes:  pupils equal and round, sclera anicteric without injection Ears:  canals without lesions, TMs shiny without retraction, no obvious effusion, no erythema Nose:  nares patent, septum midline, mucosa normal Throat/Pharynx:  lips and gingiva without lesion; tongue and uvula midline; non-inflamed pharynx; no exudates or postnasal drainage Neck: neck supple without adenopathy, thyromegaly, or masses Lungs:  clear to auscultation, breath sounds equal bilaterally, no  respiratory distress Cardio:  regular rate and rhythm, no LE edema, +SEM heard loudest at aortic listening post Abdomen:  abdomen soft, nontender; bowel sounds normal; no masses or organomegaly Rectal: Deferred Musculoskeletal:  symmetrical muscle groups noted without atrophy or deformity Extremities:  no clubbing, cyanosis, or edema, no deformities, no skin discoloration Neuro:  gait normal; deep tendon reflexes normal and symmetric Psych:  well oriented with normal range of affect and appropriate judgment/insight  Assessment and Plan  Well adult exam - Plan: CBC, Comprehensive metabolic panel, Lipid panel, Urinalysis, Routine w reflex microscopic, ck'ing UA as f/u from previous appt.  Screening for malignant neoplasm of prostate - Plan: PSA   Well 63 y.o. male. Counseled on diet and exercise. Counseled on risks and benefits of prostate cancer screening with PSA. The patient agrees to undergo testing. Immunizations, labs, and further orders as above. Follow up in 6 mo or prn. The patient voiced understanding and agreement to the plan.  Jilda Rocheicholas Paul FreerWendling, DO 05/26/19 1:01 PM

## 2019-05-27 ENCOUNTER — Other Ambulatory Visit: Payer: Self-pay | Admitting: Family Medicine

## 2019-05-27 DIAGNOSIS — R3129 Other microscopic hematuria: Secondary | ICD-10-CM

## 2019-05-27 DIAGNOSIS — E781 Pure hyperglyceridemia: Secondary | ICD-10-CM

## 2019-06-03 ENCOUNTER — Other Ambulatory Visit: Payer: Self-pay

## 2019-06-03 ENCOUNTER — Other Ambulatory Visit (INDEPENDENT_AMBULATORY_CARE_PROVIDER_SITE_OTHER): Payer: BC Managed Care – PPO

## 2019-06-03 DIAGNOSIS — R3129 Other microscopic hematuria: Secondary | ICD-10-CM | POA: Diagnosis not present

## 2019-06-03 DIAGNOSIS — E781 Pure hyperglyceridemia: Secondary | ICD-10-CM | POA: Diagnosis not present

## 2019-06-03 LAB — URINALYSIS, ROUTINE W REFLEX MICROSCOPIC
Bilirubin Urine: NEGATIVE
Hgb urine dipstick: NEGATIVE
Ketones, ur: NEGATIVE
Leukocytes,Ua: NEGATIVE
Nitrite: NEGATIVE
RBC / HPF: NONE SEEN (ref 0–?)
Specific Gravity, Urine: 1.01 (ref 1.000–1.030)
Total Protein, Urine: NEGATIVE
Urine Glucose: NEGATIVE
Urobilinogen, UA: 0.2 (ref 0.0–1.0)
WBC, UA: NONE SEEN (ref 0–?)
pH: 7 (ref 5.0–8.0)

## 2019-06-03 LAB — LIPID PANEL
Cholesterol: 140 mg/dL (ref 0–200)
HDL: 30.9 mg/dL — ABNORMAL LOW (ref >39.00)
LDL Cholesterol: 82 mg/dL (ref 0–99)
NonHDL: 108.73
Total CHOL/HDL Ratio: 5
Triglycerides: 134 mg/dL (ref 0.0–149.0)
VLDL: 26.8 mg/dL (ref 0.0–40.0)

## 2019-06-09 ENCOUNTER — Encounter: Payer: Self-pay | Admitting: Family Medicine

## 2019-06-09 ENCOUNTER — Other Ambulatory Visit: Payer: Self-pay | Admitting: Family Medicine

## 2019-06-09 DIAGNOSIS — R3129 Other microscopic hematuria: Secondary | ICD-10-CM

## 2019-06-09 IMAGING — DX DG ABDOMEN 2V
2 series · 2 of 2 positions shown · non-contrast
Comparison: None.

CLINICAL DATA: Intermittent epigastric pain. Assess for stool
burden.

EXAM:
ABDOMEN - 2 VIEW

[abdomen erect]
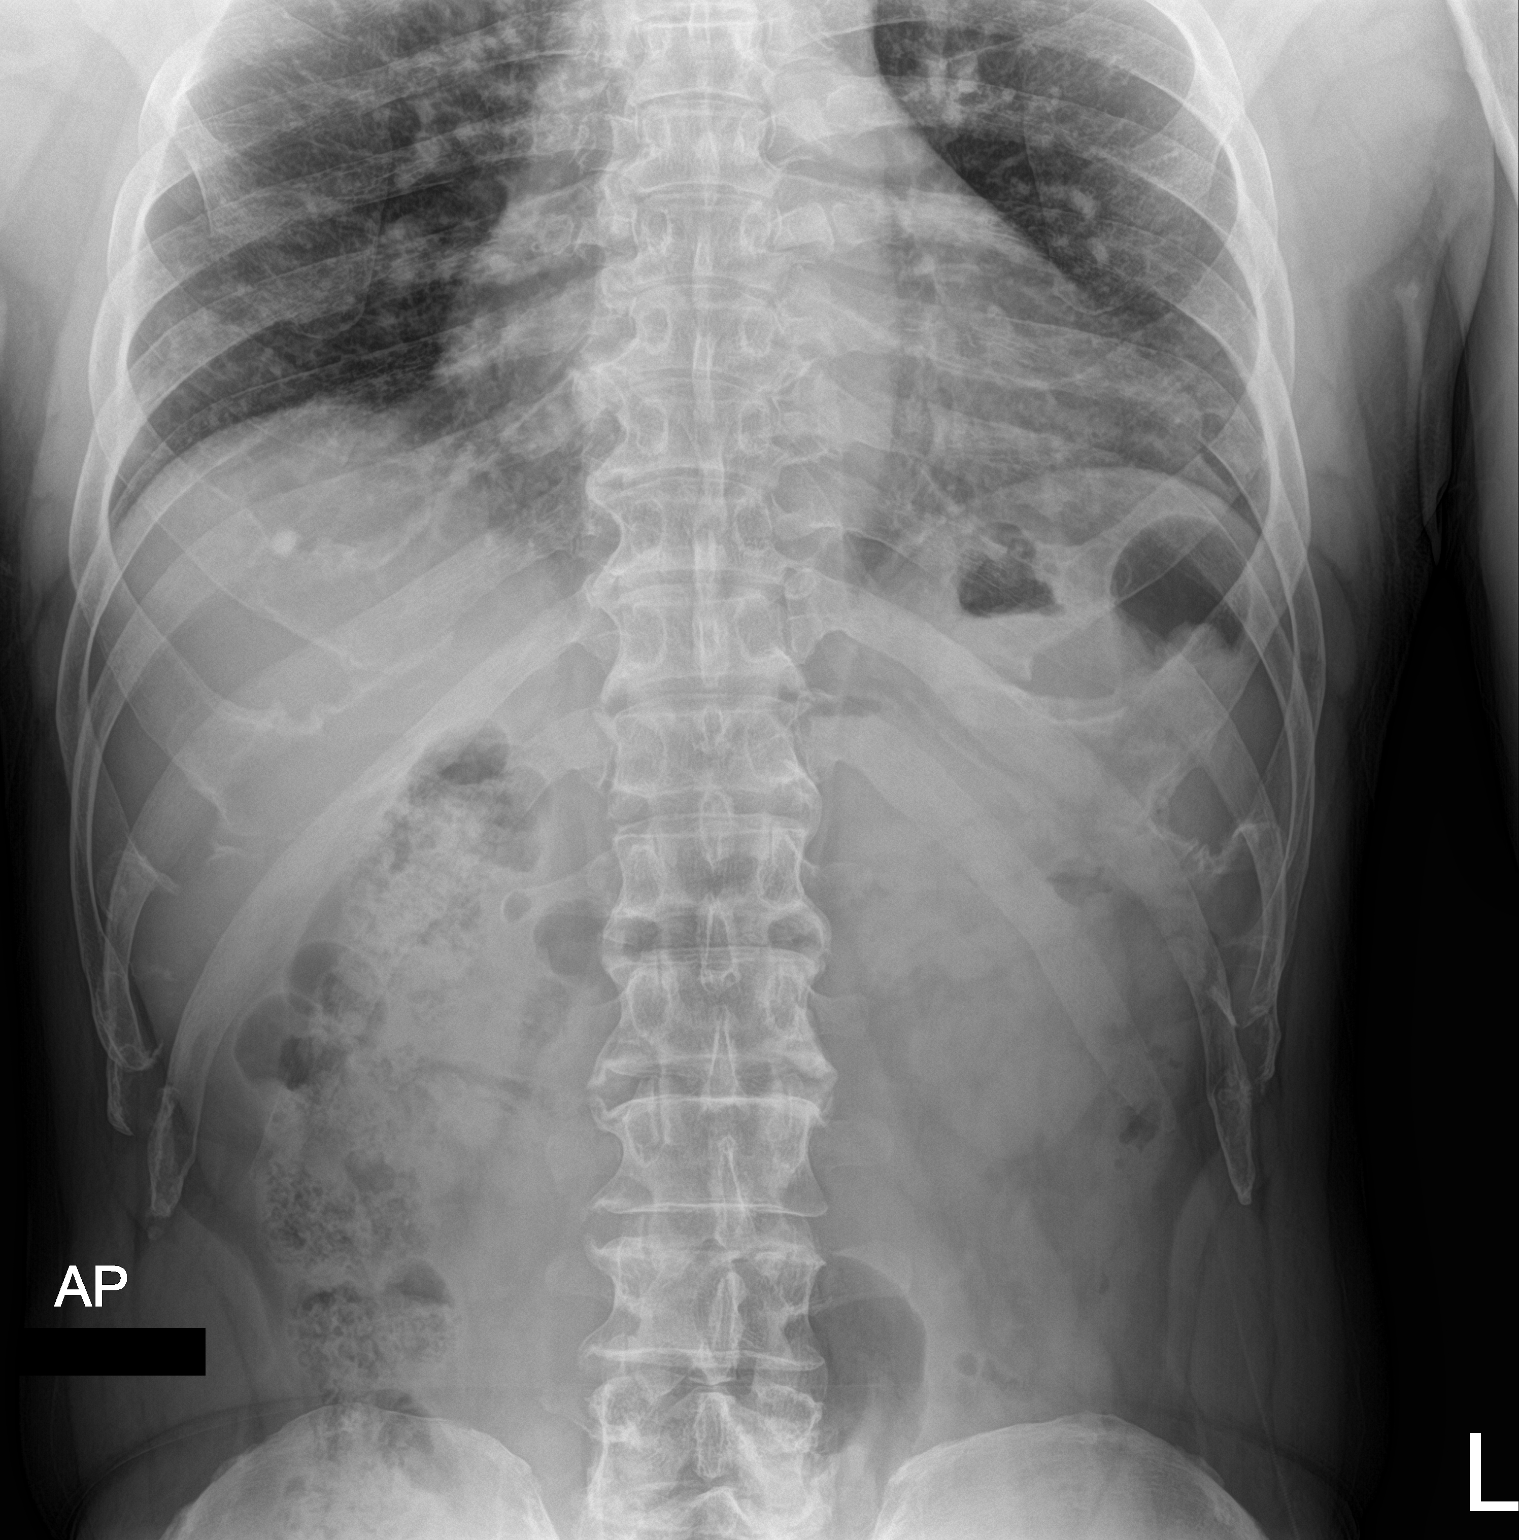

[abdomen supine]
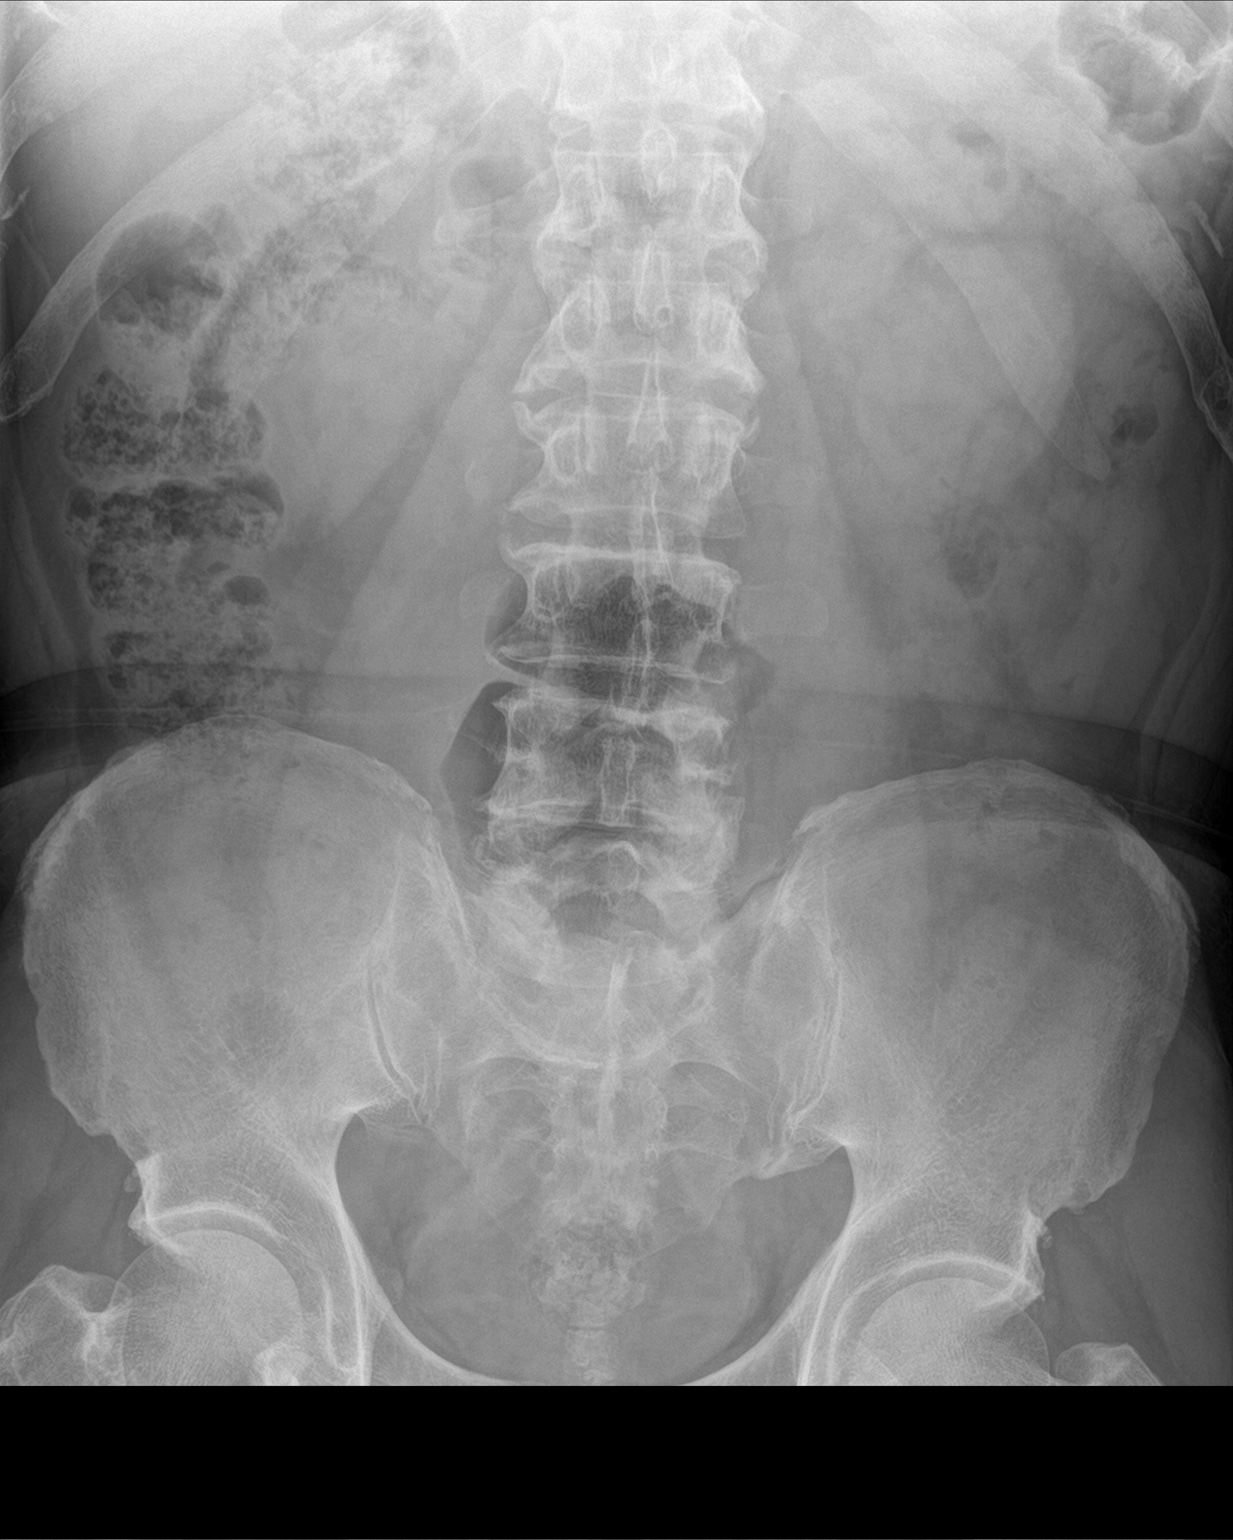

[2 of 2 positions shown; findings below may reference images not displayed]

FINDINGS: The bowel gas pattern is normal. Mild volume retained large bowel
stool. There is no evidence of free air. No radio-opaque calculi or
other significant radiographic abnormality is seen. Cardiac
silhouette may be mildly enlarged. Coarsened pulmonary interstitium
with nodular densities favoring granulomas.
IMPRESSION: Normal bowel gas pattern.

Coarsened pulmonary interstitium and multiple pulmonary nodules
versus granulomas. Recommend dedicated chest radiograph.

## 2019-06-23 ENCOUNTER — Other Ambulatory Visit: Payer: Self-pay

## 2019-06-23 ENCOUNTER — Encounter: Payer: Self-pay | Admitting: Family Medicine

## 2019-06-23 ENCOUNTER — Other Ambulatory Visit (INDEPENDENT_AMBULATORY_CARE_PROVIDER_SITE_OTHER): Payer: BC Managed Care – PPO

## 2019-06-23 ENCOUNTER — Ambulatory Visit (INDEPENDENT_AMBULATORY_CARE_PROVIDER_SITE_OTHER): Payer: BC Managed Care – PPO | Admitting: Family Medicine

## 2019-06-23 VITALS — BP 120/72 | HR 75 | Temp 98.1°F | Ht 67.0 in | Wt 175.0 lb

## 2019-06-23 DIAGNOSIS — R3129 Other microscopic hematuria: Secondary | ICD-10-CM | POA: Diagnosis not present

## 2019-06-23 DIAGNOSIS — E781 Pure hyperglyceridemia: Secondary | ICD-10-CM | POA: Diagnosis not present

## 2019-06-23 DIAGNOSIS — R109 Unspecified abdominal pain: Secondary | ICD-10-CM

## 2019-06-23 DIAGNOSIS — M25511 Pain in right shoulder: Secondary | ICD-10-CM

## 2019-06-23 LAB — URINALYSIS, ROUTINE W REFLEX MICROSCOPIC
Bilirubin Urine: NEGATIVE
Ketones, ur: NEGATIVE
Leukocytes,Ua: NEGATIVE
Nitrite: NEGATIVE
Specific Gravity, Urine: <=1.005 — AB (ref 1.000–1.030)
Total Protein, Urine: NEGATIVE
Urine Glucose: NEGATIVE
Urobilinogen, UA: 0.2 (ref 0.0–1.0)
pH: 6.5 (ref 5.0–8.0)

## 2019-06-23 LAB — LIPID PANEL
Cholesterol: 158 mg/dL (ref 0–200)
HDL: 33.2 mg/dL — ABNORMAL LOW (ref >39.00)
LDL Cholesterol: 98 mg/dL (ref 0–99)
NonHDL: 124.38
Total CHOL/HDL Ratio: 5
Triglycerides: 134 mg/dL (ref 0.0–149.0)
VLDL: 26.8 mg/dL (ref 0.0–40.0)

## 2019-06-23 MED ORDER — HYDROCODONE-ACETAMINOPHEN 5-325 MG PO TABS: 1.0000 | ORAL_TABLET | Freq: Four times a day (QID) | ORAL | 0 refills | Status: DC | PRN

## 2019-06-23 MED ORDER — TAMSULOSIN HCL 0.4 MG PO CAPS: 0.4000 mg | ORAL_CAPSULE | Freq: Every day | ORAL | 0 refills | Status: DC

## 2019-06-23 NOTE — Addendum Note (Signed)
Addended by: Caffie Pinto on: 06/23/2019 10:05 AM   Modules accepted: Orders

## 2019-06-23 NOTE — Patient Instructions (Addendum)
Jay Hawkins take tramadol with the hydrocodone/Norco.   Continue straining your urine. Let me know if things aren't getting better.  Let us know if you are not improving with either issue in the next several weeks. Options for the shoulder include physical therapy and an injection if this is quite painful.   Let us know if you need anything.   Biceps Tendon Disruption (Proximal) Rehab Do exercises exactly as told by your health care provider and adjust them as directed. It is normal to feel mild stretching, pulling, tightness, or discomfort as you do these exercises, but you should stop right away if you feel sudden pain or your pain gets worse. Stretching and range of motion exercises These exercises warm up your muscles and joints and improve the movement and flexibility of your arm and shoulder. These exercises also help to relieve pain and stiffness. Exercise A: Shoulder flexion, standing   1. Stand facing a wall. Put your left / right hand on the wall. 2. Slide your left / right hand up the wall. Stop when you feel a stretch in your shoulder, or when you reach the angle recommended by your health care provider.  Use your other hand to help raise your arm, if needed.  As your hand gets higher, you may need to step closer to the wall.  Avoid shrugging your shoulder while you raise your arm. To do this, keep your shoulder blade tucked down toward your spine. 3. Hold for 30 seconds. 4. Slowly return to the starting position. Use your other arm to help, if needed. Repeat 2 times. Complete this exercise 3 times per week. Exercise B: Pendulum   1. Stand near a wall or a surface that you can hold onto for balance. 2. Bend at the waist and let your left / right arm hang straight down. Use your other arm to support you. 3. Relax your arm and shoulder muscles, and move your hips and your trunk so your left / right arm swings freely. Your arm should swing because of the motion of your body, not  because you are using your arm or shoulder muscles. 4. Keep moving so your arm swings in the following directions, as told by your health care provider:  Side to side.  Forward and backward.  In clockwise and counterclockwise circles. 5. Slowly return to the starting position. Repeat 2 times. Complete this exercise 3 times per week.  Strengthening exercises These exercises build strength and endurance in your arm and shoulder. Endurance is the ability to use your muscles for a long time, even after your muscles get tired. Exercise C: Elbow flexion, neutral  1. Sit on a stable chair without armrests, or stand. 2. Hold a 3-5 lb weight in your left / right hand, or hold an exercise band with both hands. Your palms should face each other at the starting position. 3. Bend your left / right elbow and move your hand up toward your shoulder.  Lead with your thumb, and keep your palm facing the same direction.  Keep your other arm straight down, in the starting position. 4. Slowly return to the starting position. Repeat 2-3 times. Complete this exercise 3 times per week. Exercise D: Forearm supination   1. Sit with your left / right forearm on a table. Your elbow should be below shoulder height. Rest your hand over the edge of the table so your palm faces down. 2. If directed, hold a hammer with your left / right hand. 3.  Without moving your elbow, slowly rotate your hand so your palm faces up toward the ceiling.  If you are holding a hammer, begin by holding the hammer near the head. When this exercise gets easier for you, hold the hammer farther down the handle. 4. Hold for 3 seconds. 5. Slowly return to the starting position. Repeat 2 times. Complete this exercise 3 times per week. Exercise E: Scapular retraction   1. Sit in a stable chair without armrests, or stand. 2. Secure an exercise band to a stable object in front of you so the band is at shoulder height. 3. Hold one end of the  exercise band in each hand. 4. Squeeze your shoulder blades together and move your elbows slightly behind you. Do not shrug your shoulders. 5. Hold for 3 seconds. 6. Slowly return to the starting position. Repeat 2 times. Complete this exercise 3 times per week. Exercise F: Scapular protraction, supine   1. Lie on your back on a firm surface. Hold a 3-5 lb weight in your left / right hand. 2. Raise your left / right arm straight into the air so your hand is directly above your shoulder joint. 3. Push the weight into the air so your shoulder lifts off of the surface that you are lying on. Do not move your head, neck, or back. 4. Hold for 3 seconds. 5. Slowly return to the starting position. Let your muscles relax completely before you repeat this exercise. Repeat 2 times. Complete this exercise 3 times per week. This information is not intended to replace advice given to you by your health care provider. Make sure you discuss any questions you have with your health care provider. Document Released: 11/20/2005 Document Revised: 07/27/2016 Document Reviewed: 10/29/2015 Elsevier Interactive Patient Education  2017 [employer]

## 2019-06-23 NOTE — Progress Notes (Signed)
Musculoskeletal Exam  Patient: Jay Hawkins DOB: Apr 10, 1956  DOS: 06/23/2019  SUBJECTIVE:  Chief Complaint:   Chief Complaint  Patient presents with  . Shoulder Pain  . Nephrolithiasis    Jay Hawkins is a 63 y.o.  male for evaluation and treatment of R shoulder pain.   Onset:  2 weeks ago. No inj or change in activity.  Location: lateral and anterior shoulder Character:  sharp  Yard work/activity/certain movements seem to make it worse. Progression of issue:  is unchanged Associated symptoms: decreased ROM Treatment: to date has been rest, ice, massage, Tylenol, heat.   Neurovascular symptoms: no  Intermittent flank pain over last couple mo. +hx of kidney stones. Thinks he has one on the L. Has had blood in semen and also on urine testing. No noticeable blood in urine. Pain got much worse yesterday. Has tramadol for intermittent back pain, did not help. No fevers.   ROS: Musculoskeletal/Extremities: +R shoulder pain GI: +nausea/vomiting yesterday  Past Medical History:  Diagnosis Date  . Allergy   . Asthma   . Colon polyps   . GERD (gastroesophageal reflux disease)   . Heart murmur   . Hyperlipidemia   . Hypertension   . Tendonitis     Objective: VITAL SIGNS: BP 120/72 (BP Location: Left Arm, Patient Position: Sitting, Cuff Size: Normal)   Pulse 75   Temp 98.1 F (36.7 C) (Oral)   Ht 5\' 7"  (1.702 m)   Wt 175 lb (79.4 kg)   SpO2 98%   BMI 27.41 kg/m  Constitutional: Well formed, well developed. No acute distress. Abd: BS+, S, NT, ND Thorax & Lungs: No accessory muscle use Musculoskeletal: R shoulder.   Normal active range of motion: yes.   Normal passive range of motion: yes Tenderness to palpation: yes, over coracoid process Deformity: no Ecchymosis: no Tests positive: Speed's, Yergason's Tests negative: Lift off equiv; Neer's, cross over, Hawkins, Empty can No CVA ttp Neurologic: Normal sensory function. No focal deficits noted.  Psychiatric: Normal  mood. Age appropriate judgment and insight. Alert & oriented x 3.    Assessment:  Acute pain of right shoulder - Plan: I think it is the R prox bic tendon. Stretches/exercises given for this. Ibuprofen.   Flank pain - Plan: HYDROcodone-acetaminophen (NORCO/VICODIN) 5-325 MG tablet, tamsulosin (FLOMAX) 0.4 MG CAPS capsule; continue straining urine.   Plan: Orders as above. F/u prn. The patient voiced understanding and agreement to the plan.   [place], DO 06/23/19  9:13 AM

## 2019-07-09 ENCOUNTER — Encounter: Payer: Self-pay | Admitting: Family Medicine

## 2019-07-11 ENCOUNTER — Other Ambulatory Visit: Payer: Self-pay | Admitting: Family Medicine

## 2019-07-29 ENCOUNTER — Ambulatory Visit: Payer: BC Managed Care – PPO

## 2019-08-12 ENCOUNTER — Ambulatory Visit (INDEPENDENT_AMBULATORY_CARE_PROVIDER_SITE_OTHER): Payer: BC Managed Care – PPO

## 2019-08-12 ENCOUNTER — Other Ambulatory Visit: Payer: Self-pay

## 2019-08-12 DIAGNOSIS — Z23 Encounter for immunization: Secondary | ICD-10-CM

## 2019-08-12 NOTE — Progress Notes (Signed)
Pre visit review using our clinic review tool, if applicable. No additional management support is needed unless otherwise documented below in the visit note.  Patient here today for second shignrix vaccine 0.1mL shingrix given in left deltoid IM. Patient tolerated well. VIS given.

## 2019-09-12 ENCOUNTER — Other Ambulatory Visit: Payer: Self-pay

## 2019-09-12 ENCOUNTER — Ambulatory Visit: Payer: BC Managed Care – PPO | Admitting: Family Medicine

## 2019-09-12 ENCOUNTER — Encounter: Payer: Self-pay | Admitting: Family Medicine

## 2019-09-12 VITALS — BP 140/72 | HR 65 | Temp 97.8°F | Ht 67.0 in | Wt 174.2 lb

## 2019-09-12 DIAGNOSIS — G8929 Other chronic pain: Secondary | ICD-10-CM

## 2019-09-12 DIAGNOSIS — Z23 Encounter for immunization: Secondary | ICD-10-CM

## 2019-09-12 DIAGNOSIS — M25511 Pain in right shoulder: Secondary | ICD-10-CM | POA: Diagnosis not present

## 2019-09-12 MED ORDER — METHYLPREDNISOLONE ACETATE 40 MG/ML IJ SUSP
40.0000 mg | Freq: Once | INTRAMUSCULAR | Status: AC
  Administered 2019-09-12: 40 mg via INTRA_ARTICULAR

## 2019-09-12 MED ORDER — MELOXICAM 15 MG PO TABS: 15.0000 mg | ORAL_TABLET | Freq: Every day | ORAL | 0 refills | Status: DC

## 2019-09-12 NOTE — Addendum Note (Signed)
Addended by: Sharon Seller B on: 09/12/2019 11:30 AM   Modules accepted: Orders

## 2019-09-12 NOTE — Progress Notes (Signed)
Musculoskeletal Exam  Patient: Jay Hawkins DOB: 27-Jan-1956  DOS: 09/12/2019  SUBJECTIVE:  Chief Complaint:   Chief Complaint  Patient presents with  . Follow-up    Jay Hawkins is a 63 y.o.  male for evaluation and treatment of R shoulder pain.   Onset:  4 weeks ago. No inj or change in activity.  Location: lateral R  Character:  aching  Progression of issue:  is unchanged Associated symptoms: nothing new Treatment: to date has been ice, OTC NSAIDS, acetaminophen, home exercises and heat.   Neurovascular symptoms: no  ROS: Musculoskeletal/Extremities: +R shoulder pain  Past Medical History:  Diagnosis Date  . Allergy   . Asthma   . Colon polyps   . GERD (gastroesophageal reflux disease)   . Heart murmur   . Hyperlipidemia   . Hypertension   . Tendonitis     Objective: VITAL SIGNS: BP 140/72 (BP Location: Left Arm, Patient Position: Sitting, Cuff Size: Normal)   Pulse 65   Temp 97.8 F (36.6 C) (Temporal)   Ht 5\' 7"  (1.702 m)   Wt 174 lb 4 oz (79 kg)   SpO2 96%   BMI 27.29 kg/m  Constitutional: Well formed, well developed. No acute distress. Thorax & Lungs: No accessory muscle use Musculoskeletal: R shoulder.   Normal active range of motion: yes.   Normal passive range of motion: yes Tenderness to palpation: Yes over prox biceps tendon short head, prox lateral deltoid Deformity: no Ecchymosis: no Tests positive: Neer's, Hawkins, Empty can, Speed's Tests negative: Cross over, lift off Neurologic: Normal sensory function.  Psychiatric: Normal mood. Age appropriate judgment and insight. Alert & oriented x 3.    Procedure Note; Shoulder bursa injection Verbal consent obtained. The area was palpated, an area was marked just caudal to the acromion process laterally, and cleaned with alcohol x1. A 27-gauge needle was used to enter the joint laterally with ease. 40 mg of Depomedrol with 2 mL of 1% lidocaine was injected. The patient tolerated the procedure  well. There were no complications noted.   Assessment:  Chronic right shoulder pain - Plan: Ambulatory referral to Physical Therapy, PR DRAIN/INJECT LARGE JOINT/BURSA, meloxicam (MOBIC) 15 MG tablet  Need for influenza vaccination - Plan: Flu Vaccine QUAD 6+ mos PF IM (Fluarix Quad PF)  Plan: Injection, PT, NSAID, Tylenol, continue stretches. If no improvement in 4-6 weeks, send message and will refer to Sports Med.  F/u as originally scheduled. The patient voiced understanding and agreement to the plan.   [place], DO 09/12/19  11:21 AM

## 2019-09-12 NOTE — Patient Instructions (Addendum)
If you do not hear anything about your referral in the next 1-2 weeks, call our office and ask for an update.  Ice/cold pack over area for 10-15 min twice daily.  Heat (pad or rice pillow in microwave) over affected area, 10-15 minutes twice daily.   Continue the stretches.  Send me a message in the next 4-6 weeks if no meaningful improvement. The next step after PT would be to set you up with the sports medicine team.   Let us know if you need anything.

## 2019-11-24 ENCOUNTER — Other Ambulatory Visit: Payer: Self-pay

## 2019-11-25 ENCOUNTER — Other Ambulatory Visit: Payer: Self-pay

## 2019-11-25 ENCOUNTER — Encounter: Payer: Self-pay | Admitting: Family Medicine

## 2019-11-25 ENCOUNTER — Ambulatory Visit: Payer: BC Managed Care – PPO | Admitting: Family Medicine

## 2019-11-25 VITALS — BP 132/80 | HR 72 | Temp 96.6°F | Ht 67.0 in | Wt 175.0 lb

## 2019-11-25 DIAGNOSIS — G8929 Other chronic pain: Secondary | ICD-10-CM

## 2019-11-25 DIAGNOSIS — M25511 Pain in right shoulder: Secondary | ICD-10-CM

## 2019-11-25 DIAGNOSIS — J302 Other seasonal allergic rhinitis: Secondary | ICD-10-CM | POA: Diagnosis not present

## 2019-11-25 DIAGNOSIS — M545 Low back pain, unspecified: Secondary | ICD-10-CM

## 2019-11-25 DIAGNOSIS — R011 Cardiac murmur, unspecified: Secondary | ICD-10-CM

## 2019-11-25 NOTE — Patient Instructions (Signed)
Keep the diet clean and stay active.  Let me know when/if you need refills.  Your shoulder looks great!  Consider weaning down on the stretches/exercises for the shoulder if you handle it well.  Let us know if you need anything.

## 2019-11-25 NOTE — Progress Notes (Signed)
Chief Complaint  Patient presents with  . Follow-up    Subjective: Patient is a 63 y.o. male here for med ck.  Pt w hx of intermittent low back pain. NSAIDs are not particularly helpful always and he will rarely use Tramadol. He tolerates this well w no AE's. Uses 1-2 times per month at most.   Hx of seasonal allergies. Takes Zyrtec and Flonase. No issues with these. Controls allergies well. Saline rinses are also helpful.   F/u for chronic shoulder pain, received injection and stretches/exercises. Very pleased with his progress. ROM is better, no pain or catching. Still doing stretches/exercises. No weakness.    ROS: Heart: Denies chest pain  Lungs: Denies SOB   Past Medical History:  Diagnosis Date  . Allergy   . Asthma   . Colon polyps   . GERD (gastroesophageal reflux disease)   . Heart murmur   . Hyperlipidemia   . Hypertension   . Tendonitis     Objective: BP 132/80 (BP Location: Left Arm, Patient Position: Sitting, Cuff Size: Normal)   Pulse 72   Temp (!) 96.6 F (35.9 C) (Temporal)   Ht 5\' 7"  (1.702 m)   Wt 175 lb (79.4 kg)   SpO2 95%   BMI 27.41 kg/m  General: Awake, appears stated age HEENT: MMM, EOMi Heart: RRR, 3/6 SEM heard loudest at aortic listening post w radiation to carotids, no LE edema Lungs: CTAB, no rales, wheezes or rhonchi. No accessory muscle use MSK: good ROM of R shoulder passive and active, no ttp, neg Neer's, Hawkins, lift off, cross over, speed's Psych: Age appropriate judgment and insight, normal affect and mood  Assessment and Plan: Chronic low back pain without sciatica, unspecified back pain laterality  Seasonal allergies  Chronic right shoulder pain  Heart murmur  1- Cont tramadol prn. 2- Cont Zyrtec and Flonase 3- Cont stretches/exercises. Might be able to cut down on freq of stretches/exercises if able.  4- Cont to monitor, asymptomatic, no changes in exam, not fluid overloaded. F/u in 6 mo for CPE or prn.  The patient  voiced understanding and agreement to the plan.  [place], DO 11/25/19  7:58 AM

## 2020-01-20 ENCOUNTER — Encounter: Payer: Self-pay | Admitting: Family Medicine

## 2020-01-20 ENCOUNTER — Other Ambulatory Visit: Payer: Self-pay

## 2020-01-20 ENCOUNTER — Ambulatory Visit (INDEPENDENT_AMBULATORY_CARE_PROVIDER_SITE_OTHER): Payer: BC Managed Care – PPO | Admitting: Family Medicine

## 2020-01-20 VITALS — Temp 98.2°F

## 2020-01-20 DIAGNOSIS — U071 COVID-19: Secondary | ICD-10-CM | POA: Diagnosis not present

## 2020-01-20 NOTE — Progress Notes (Signed)
Chief Complaint  Patient presents with  . Follow-up    positive for Covid    Dannie Kell here for URI complaints. Due to COVID-19 pandemic, we are interacting via web portal for an electronic face-to-face visit. I verified patient's ID using 2 identifiers. Patient agreed to proceed with visit via this method. Patient is at home, I am at office. Patient and I are present for visit.   Duration: 5 days  Associated symptoms: shortness of breath and chills/clamminess Denies: sinus congestion, sinus pain, rhinorrhea, itchy watery eyes, ear pain, ear drainage, sore throat, wheezing, chest pain and fevers, digestive s/s's Treatment to date: Rest, fluids Sick contacts: Yes- woman dx'd w covid at church; he tested positive yesterday  ROS:  Const: Denies fevers HEENT: As noted in HPI Lungs: No cough  Past Medical History:  Diagnosis Date  . Allergy   . Asthma   . Colon polyps   . GERD (gastroesophageal reflux disease)   . Heart murmur   . Hyperlipidemia   . Hypertension   . Tendonitis    Exam Temp 98.2 F (36.8 C) (Oral)  No conversational dyspnea Age appropriate judgment and insight Nml affect and mood  COVID-19  Discussed above and warning s/s's. Letter for work on Mon if he is fever free for 24 hrs w/o anti-pyretics and s/s's are improving as he will be 10 d out at this point.  Pt voiced understanding and agreement to the plan.  Jilda Roche [place], DO 01/20/20 2:26 PM

## 2020-01-25 ENCOUNTER — Encounter: Payer: Self-pay | Admitting: Family Medicine

## 2020-01-26 ENCOUNTER — Encounter: Payer: Self-pay | Admitting: Family Medicine

## 2020-02-05 ENCOUNTER — Encounter: Payer: Self-pay | Admitting: Family Medicine

## 2020-02-09 ENCOUNTER — Encounter: Payer: Self-pay | Admitting: Family Medicine

## 2020-05-25 ENCOUNTER — Encounter: Payer: BC Managed Care – PPO | Admitting: Family Medicine

## 2020-05-26 ENCOUNTER — Other Ambulatory Visit (INDEPENDENT_AMBULATORY_CARE_PROVIDER_SITE_OTHER): Payer: BC Managed Care – PPO

## 2020-05-26 ENCOUNTER — Ambulatory Visit (INDEPENDENT_AMBULATORY_CARE_PROVIDER_SITE_OTHER): Payer: BLUE CROSS/BLUE SHIELD | Admitting: Family Medicine

## 2020-05-26 ENCOUNTER — Encounter: Payer: Self-pay | Admitting: Family Medicine

## 2020-05-26 ENCOUNTER — Other Ambulatory Visit: Payer: Self-pay

## 2020-05-26 VITALS — BP 122/80 | HR 61 | Temp 95.1°F | Ht 67.0 in | Wt 183.2 lb

## 2020-05-26 DIAGNOSIS — Z0000 Encounter for general adult medical examination without abnormal findings: Secondary | ICD-10-CM

## 2020-05-26 DIAGNOSIS — R739 Hyperglycemia, unspecified: Secondary | ICD-10-CM | POA: Diagnosis not present

## 2020-05-26 DIAGNOSIS — Z125 Encounter for screening for malignant neoplasm of prostate: Secondary | ICD-10-CM

## 2020-05-26 LAB — COMPREHENSIVE METABOLIC PANEL
ALT: 24 U/L (ref 0–53)
AST: 20 U/L (ref 0–37)
Albumin: 4.1 g/dL (ref 3.5–5.2)
Alkaline Phosphatase: 50 U/L (ref 39–117)
BUN: 13 mg/dL (ref 6–23)
CO2: 32 mEq/L (ref 19–32)
Calcium: 9.2 mg/dL (ref 8.4–10.5)
Chloride: 103 mEq/L (ref 96–112)
Creatinine, Ser: 1.15 mg/dL (ref 0.40–1.50)
GFR: 64.19 mL/min (ref >60.00)
Glucose, Bld: 117 mg/dL — ABNORMAL HIGH (ref 70–99)
Potassium: 4.4 mEq/L (ref 3.5–5.1)
Sodium: 140 mEq/L (ref 135–145)
Total Bilirubin: 0.5 mg/dL (ref 0.2–1.2)
Total Protein: 6.7 g/dL (ref 6.0–8.3)

## 2020-05-26 LAB — LIPID PANEL
Cholesterol: 179 mg/dL (ref 0–200)
HDL: 34.4 mg/dL — ABNORMAL LOW (ref >39.00)
LDL Cholesterol: 106 mg/dL — ABNORMAL HIGH (ref 0–99)
NonHDL: 144.41
Total CHOL/HDL Ratio: 5
Triglycerides: 192 mg/dL — ABNORMAL HIGH (ref 0.0–149.0)
VLDL: 38.4 mg/dL (ref 0.0–40.0)

## 2020-05-26 LAB — CBC
HCT: 45.6 % (ref 39.0–52.0)
Hemoglobin: 15.5 g/dL (ref 13.0–17.0)
MCHC: 34 g/dL (ref 30.0–36.0)
MCV: 90.7 fl (ref 78.0–100.0)
Platelets: 233 10*3/uL (ref 150.0–400.0)
RBC: 5.03 Mil/uL (ref 4.22–5.81)
RDW: 13.5 % (ref 11.5–15.5)
WBC: 6.2 10*3/uL (ref 4.0–10.5)

## 2020-05-26 LAB — PSA: PSA: 1.11 ng/mL (ref 0.10–4.00)

## 2020-05-26 LAB — HEMOGLOBIN A1C: Hgb A1c MFr Bld: 5.8 % (ref 4.6–6.5)

## 2020-05-26 MED ORDER — FLUTICASONE PROPIONATE HFA 110 MCG/ACT IN AERO: 2.0000 | INHALATION_SPRAY | Freq: Two times a day (BID) | RESPIRATORY_TRACT | 2 refills | Status: AC

## 2020-05-26 MED ORDER — ALBUTEROL SULFATE HFA 108 (90 BASE) MCG/ACT IN AERS
2.0000 | INHALATION_SPRAY | Freq: Four times a day (QID) | RESPIRATORY_TRACT | 2 refills | Status: AC | PRN
Start: 2020-05-26 — End: ?

## 2020-05-26 NOTE — Progress Notes (Signed)
Chief Complaint  Patient presents with  . Annual Exam    Well Male Jay Hawkins is here for a complete physical.   His last physical was >1 year ago.  Current diet: in general, a "healthy" diet.  Current exercise: walking, cycling, wt lifting Weight trend: has gained some muscle Fatigue? No. Seat belt? Yes.    Health maintenance Shingrix- Yes Colonoscopy- Yes Tetanus- Yes HIV- Yes Hep C- Yes  Past Medical History:  Diagnosis Date  . Allergy   . Asthma   . Colon polyps   . GERD (gastroesophageal reflux disease)   . Heart murmur   . Hyperlipidemia   . Hypertension   . Tendonitis       Past Surgical History:  Procedure Laterality Date  . LEFT HEART CATHETERIZATION WITH CORONARY ANGIOGRAM N/A 12/11/2014   Procedure: LEFT HEART CATHETERIZATION WITH CORONARY ANGIOGRAM;  Surgeon: Lennette Bihari, MD;  Location: [facility] CATH LAB;  Service: Cardiovascular;  Laterality: N/A;  . NASAL POLYP SURGERY    . TONSILLECTOMY      Medications  Current Outpatient Medications on File Prior to Visit  Medication Sig Dispense Refill  . Cetirizine HCl (ZYRTEC ALLERGY) 10 MG TBDP Take 10 mg by mouth daily as needed. 30 tablet 0  . fluticasone (FLONASE) 50 MCG/ACT nasal spray Place 2 sprays into both nostrils daily. 16 g 0  . Multiple Vitamins-Minerals (THERA M PLUS PO) Take 1 tablet by mouth daily.    . traMADol (ULTRAM) 50 MG tablet Take 1 tablet (50 mg total) by mouth every 12 (twelve) hours as needed. (Patient not taking: Reported on 05/26/2020) 30 tablet 1   Allergies Allergies  Allergen Reactions  . Niacin And Related Itching    Family History Family History  Problem Relation Age of Onset  . Cancer Mother   . Hypertension Mother   . Cancer Father     Review of Systems: Constitutional:  no fevers Eye:  no recent significant change in vision Ear/Nose/Mouth/Throat:  Ears:  no hearing loss Nose/Mouth/Throat:  no complaints of nasal congestion, no sore throat Cardiovascular:  no  chest pain, no palpitations Respiratory:  no cough and +DOE Gastrointestinal:  no abdominal pain, no change in bowel habits GU:  Male: negative for dysuria, frequency, and incontinence and negative for prostate symptoms Musculoskeletal/Extremities:  no pain, redness, or swelling of the joints Integumentary (Skin/Breast):  no abnormal skin lesions reported Neurologic:  no headaches Endocrine: No unexpected weight changes Hematologic/Lymphatic:  no abnormal bleeding  Exam BP 122/80 (BP Location: Left Arm, Patient Position: Sitting, Cuff Size: Normal)   Pulse 61   Temp (!) 95.1 F (35.1 C) (Temporal)   Ht 5\' 7"  (1.702 m)   Wt 183 lb 4 oz (83.1 kg)   SpO2 100%   BMI 28.70 kg/m  General:  well developed, well nourished, in no apparent distress Skin:  no significant moles, warts, or growths Head:  no masses, lesions, or tenderness Eyes:  pupils equal and round, sclera anicteric without injection Ears:  canals without lesions, TMs shiny without retraction, no obvious effusion, no erythema Nose:  nares patent, septum midline, mucosa normal Throat/Pharynx:  lips and gingiva without lesion; tongue and uvula midline; non-inflamed pharynx; no exudates or postnasal drainage Neck: neck supple without adenopathy, thyromegaly, or masses Cardiac: RRR, no bruits, no LE edema Lungs:  clear to auscultation, breath sounds equal bilaterally, no respiratory distress Rectal: Deferred Musculoskeletal:  symmetrical muscle groups noted without atrophy or deformity Neuro:  gait normal; deep tendon reflexes  normal and symmetric Psych: well oriented with normal range of affect and appropriate judgment/insight  Assessment and Plan  Well adult exam - Plan: CBC, Comprehensive metabolic panel, Lipid panel  Screening for malignant neoplasm of prostate - Plan: PSA   Well 64 y.o. male. Counseled on diet and exercise. Counseled on risks and benefits of prostate cancer screening with PSA. The patient agrees to  undergo testing. Refill SABA. Has been on ICS in past will restart given issues. He will contact his cardiologist.  Immunizations, labs, and further orders as above. Follow up in 6 mo or prn. The patient voiced understanding and agreement to the plan.  [place], DO 05/26/20 7:16 AM

## 2020-05-26 NOTE — Patient Instructions (Addendum)
Give [place] 2-3 business days to get the results of your labs back.   Keep the diet clean and stay active.  Call your heart doctor.  Rinse mouth out after using Flovent (fluticasone). No need to do after using rescue inhaler though.   Let us know if you need anything.

## 2020-06-10 DIAGNOSIS — I350 Nonrheumatic aortic (valve) stenosis: Secondary | ICD-10-CM | POA: Diagnosis not present

## 2020-06-10 DIAGNOSIS — R002 Palpitations: Secondary | ICD-10-CM | POA: Diagnosis not present

## 2020-06-11 DIAGNOSIS — I498 Other specified cardiac arrhythmias: Secondary | ICD-10-CM | POA: Diagnosis not present

## 2020-06-28 DIAGNOSIS — I350 Nonrheumatic aortic (valve) stenosis: Secondary | ICD-10-CM | POA: Diagnosis not present

## 2020-07-06 DIAGNOSIS — R030 Elevated blood-pressure reading, without diagnosis of hypertension: Secondary | ICD-10-CM | POA: Diagnosis not present

## 2020-07-06 DIAGNOSIS — I350 Nonrheumatic aortic (valve) stenosis: Secondary | ICD-10-CM | POA: Diagnosis not present

## 2020-07-06 DIAGNOSIS — J339 Nasal polyp, unspecified: Secondary | ICD-10-CM | POA: Diagnosis not present

## 2020-07-06 DIAGNOSIS — M545 Low back pain: Secondary | ICD-10-CM | POA: Diagnosis not present

## 2020-07-07 DIAGNOSIS — R030 Elevated blood-pressure reading, without diagnosis of hypertension: Secondary | ICD-10-CM | POA: Diagnosis not present

## 2020-07-07 DIAGNOSIS — Z0189 Encounter for other specified special examinations: Secondary | ICD-10-CM | POA: Diagnosis not present

## 2020-07-11 DIAGNOSIS — R030 Elevated blood-pressure reading, without diagnosis of hypertension: Secondary | ICD-10-CM | POA: Diagnosis not present

## 2020-08-11 DIAGNOSIS — I350 Nonrheumatic aortic (valve) stenosis: Secondary | ICD-10-CM | POA: Diagnosis not present

## 2020-08-17 DIAGNOSIS — I083 Combined rheumatic disorders of mitral, aortic and tricuspid valves: Secondary | ICD-10-CM | POA: Diagnosis not present

## 2020-08-18 IMAGING — CT CT CHEST W/ CM
2 of 3 series · 15 of 36 positions shown, 18 images · IV contrast (iopamidol)
Comparison: Cardiac CT 02/16/2012.

CLINICAL DATA: Follow-up of pulmonary nodules identified on a prior
cardiac CT. Current history of asthma and hypertension.

EXAM:
CT CHEST WITH CONTRAST
TECHNIQUE: Multidetector CT imaging of the chest was performed during
intravenous contrast administration.
CONTRAST:  80mL [id number] IOPAMIDOL INJECTION 61% IV.

[Series 2: axial st · axial · 0.85mm/px · z∈[-351,-75]mm · 12 of 163 slices shown, 15 images]
[im 13/163  mediastinal]
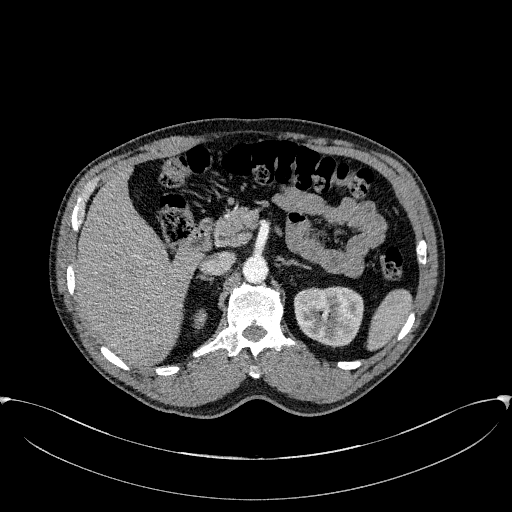
[im 13/163  lung]
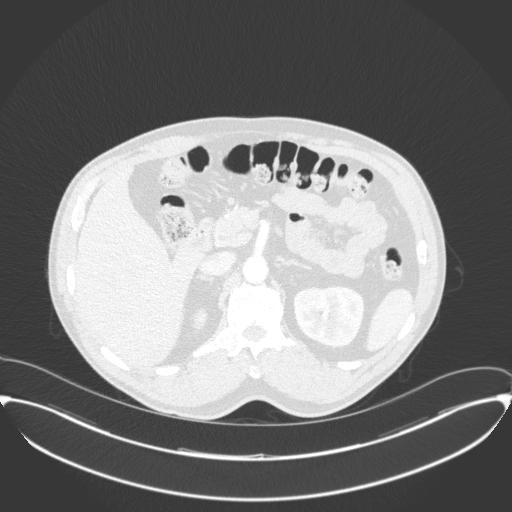
[im 25/163  lung]
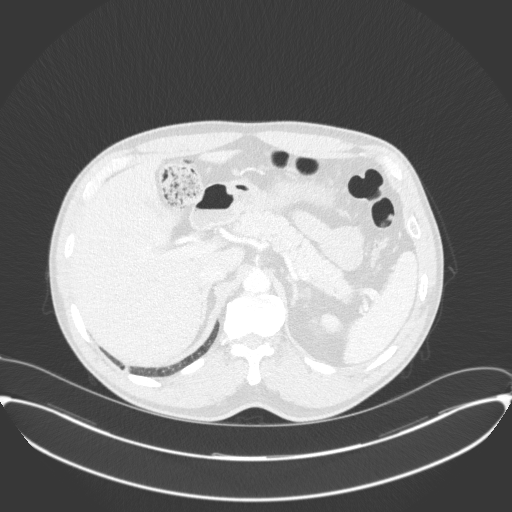
[im 37/163  lung]
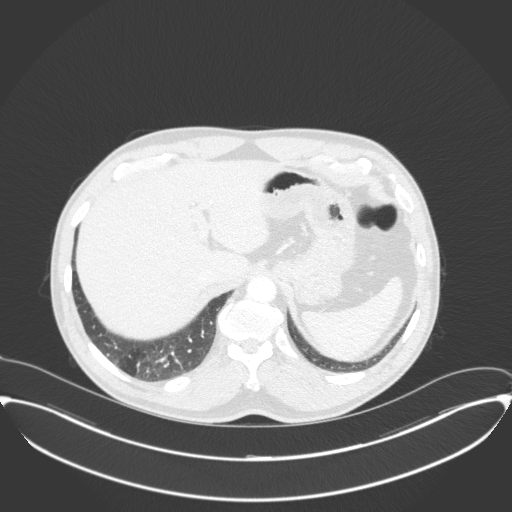
[im 49/163  lung]
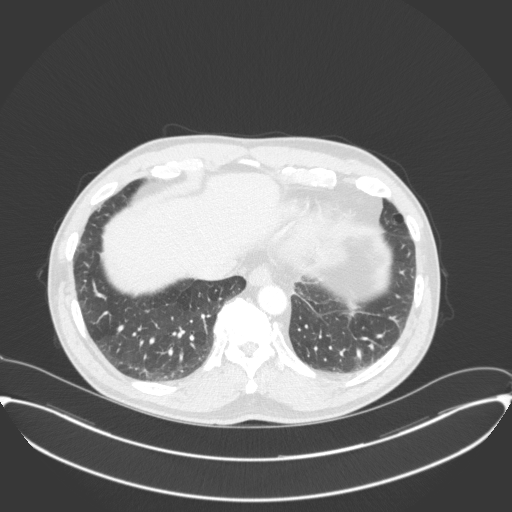
[im 61/163  mediastinal]
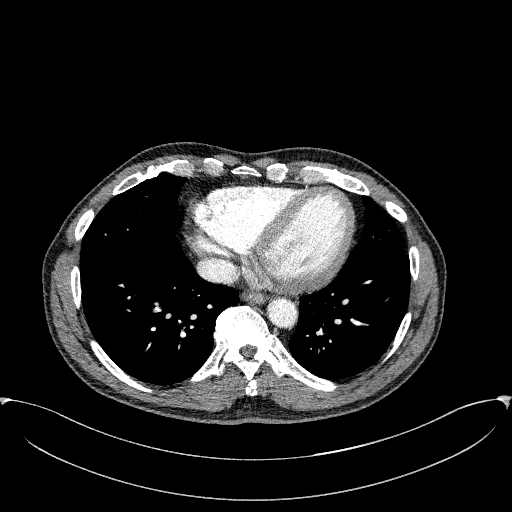
[im 61/163  lung]
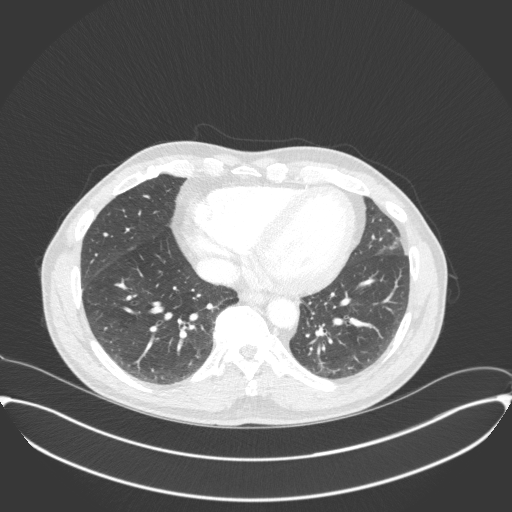
[im 73/163  lung]
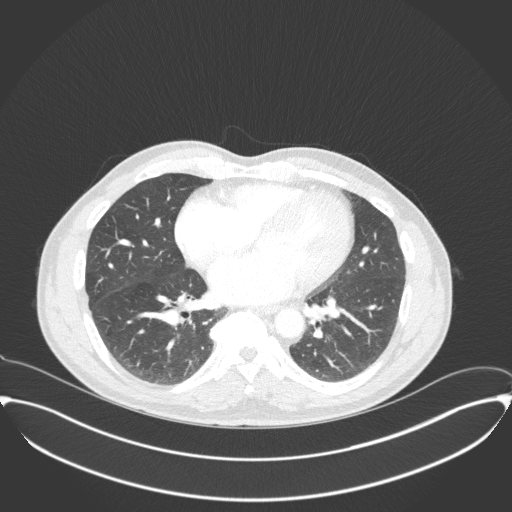
[im 91/163  lung]
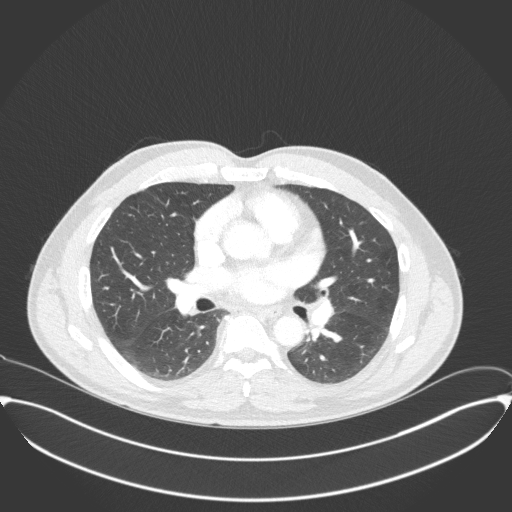
[im 103/163  lung]
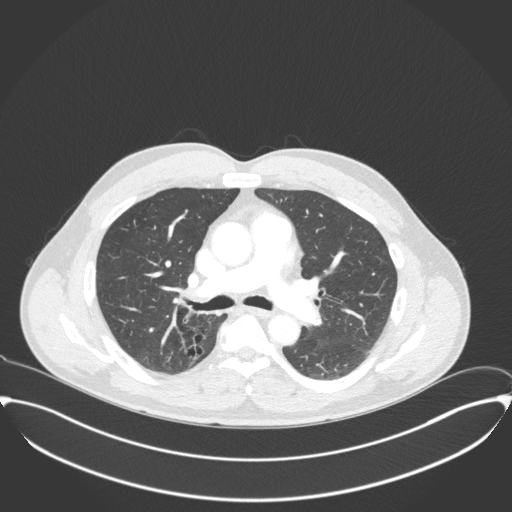
[im 115/163  mediastinal]
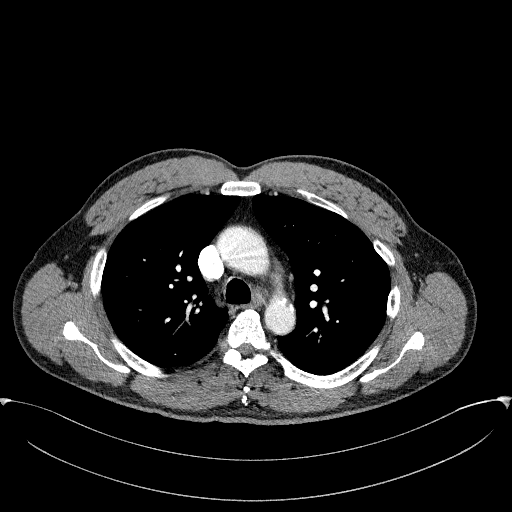
[im 115/163  lung]
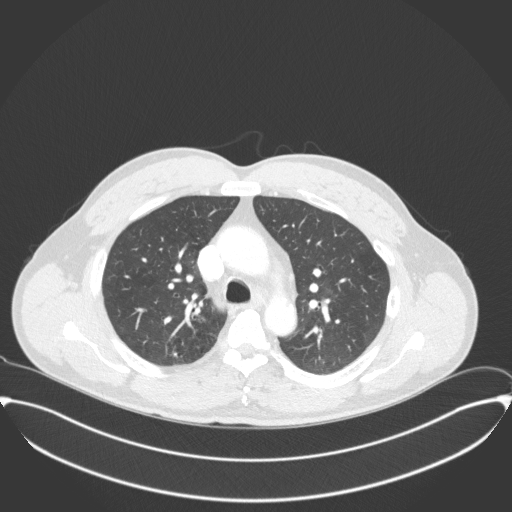
[im 127/163  lung]
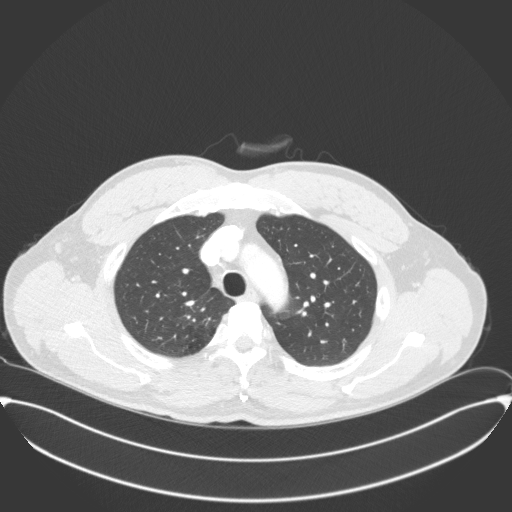
[im 139/163  lung]
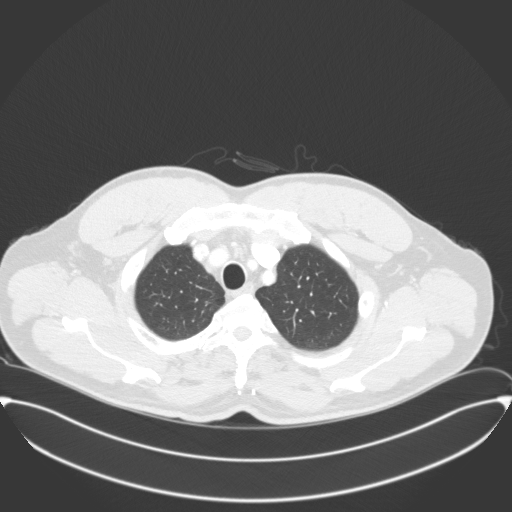
[im 151/163  lung]
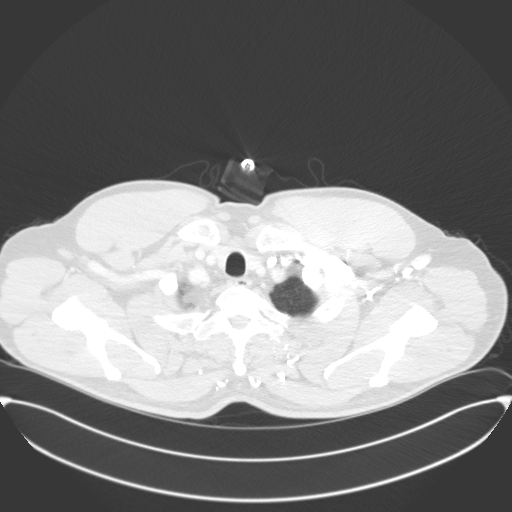

[Series 5: coronal · coronal · 0.69mm/px · 3 of 129 slices shown]
[im 26/129  lung]
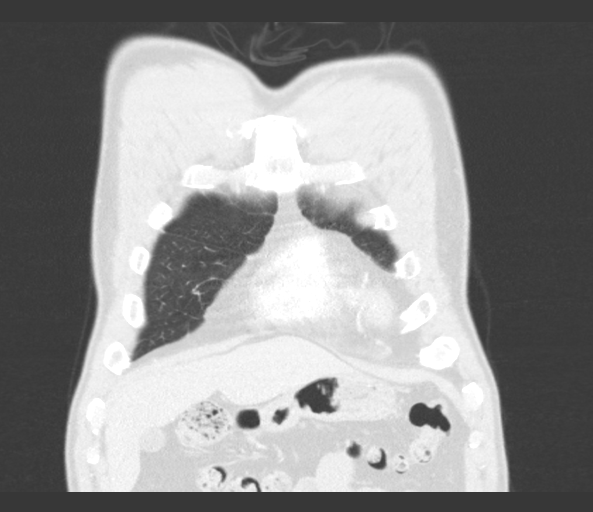
[im 52/129  lung]
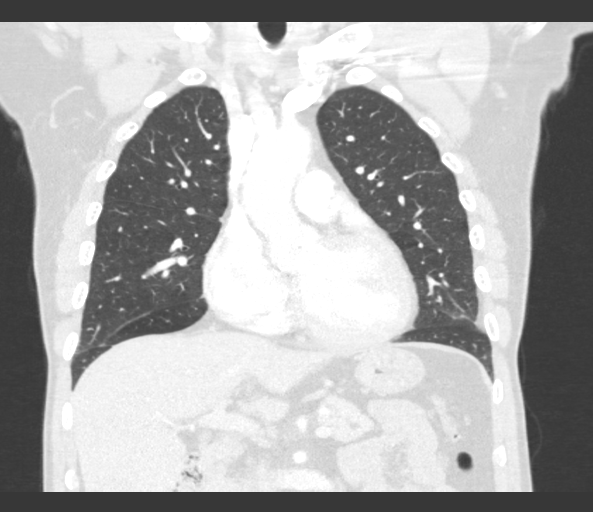
[im 77/129  lung]
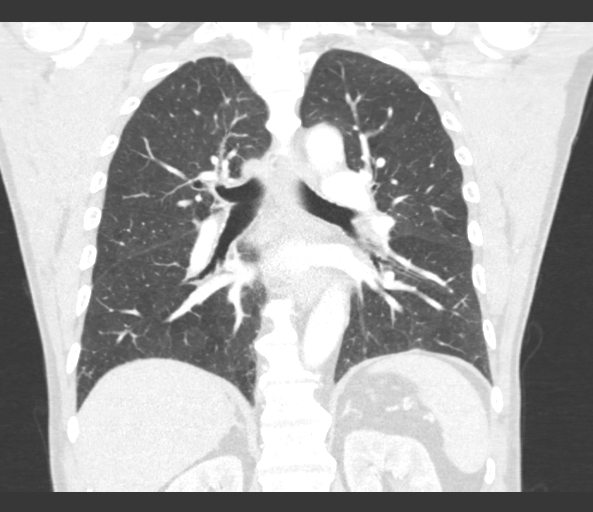

[15 of 36 positions shown; findings below may reference images not displayed]

FINDINGS: Cardiovascular: Heart size upper normal. Mild aortic valvular
calcification as noted previously. No visible coronary
atherosclerosis. No pericardial effusion. Central pulmonary arteries
patent. Mild atherosclerosis involving the distal aortic arch with
calcified and noncalcified plaque. Minimal atherosclerosis at the
origin of the LEFT subclavian and innominate arteries.

Mediastinum/Nodes: No pathologically enlarged mediastinal, hilar or
axillary lymph nodes. No mediastinal masses. Normal-appearing
esophagus. Visualized thyroid gland normal in appearance.

Lungs/Pleura: 6 mm nodule in the lingula which may be partially
calcified, unchanged since the prior CT. 5 mm nodule deep in the
LEFT LOWER LOBE (series 3, image 118), also unchanged. No new or
enlarging nodules in either lung. Bronchiectasis and scarring
involving the POSTERIOR RIGHT UPPER LOBE, unchanged. Densely
calcified granuloma deep in the RIGHT LOWER LOBE posteriorly. Lung
parenchyma otherwise clear. Scattered areas of mild hyperlucency in
both lungs. No evidence of interstitial lung disease. No pleural
effusions. Central airways patent with moderate bronchial wall
thickening.

Upper Abdomen: Unremarkable for the early arterial phase of
enhancement.

Musculoskeletal: Diffuse thoracic spondylosis.  No acute findings.
IMPRESSION: 1. Stable LEFT lung nodules when compared to the prior CT from 8896,
indicating benignity.
2. Moderate bronchial wall thickening and scattered areas of
localized air trapping indicating bronchitis and/or asthma. No acute
cardiopulmonary disease otherwise.
3. Stable bronchiectasis and scarring involving the POSTERIOR RIGHT
UPPER LOBE.
4. Stable mild aortic valvular calcification.

Aortic Atherosclerosis, minimal.  (LDHI8-170.0)

## 2020-09-03 DIAGNOSIS — Z0189 Encounter for other specified special examinations: Secondary | ICD-10-CM | POA: Diagnosis not present

## 2020-09-03 DIAGNOSIS — I350 Nonrheumatic aortic (valve) stenosis: Secondary | ICD-10-CM | POA: Diagnosis not present

## 2020-09-08 DIAGNOSIS — I2510 Atherosclerotic heart disease of native coronary artery without angina pectoris: Secondary | ICD-10-CM | POA: Diagnosis not present

## 2020-09-08 DIAGNOSIS — I350 Nonrheumatic aortic (valve) stenosis: Secondary | ICD-10-CM | POA: Diagnosis not present

## 2020-09-14 DIAGNOSIS — I350 Nonrheumatic aortic (valve) stenosis: Secondary | ICD-10-CM | POA: Diagnosis not present

## 2020-09-14 DIAGNOSIS — I351 Nonrheumatic aortic (valve) insufficiency: Secondary | ICD-10-CM | POA: Diagnosis not present

## 2020-09-21 DIAGNOSIS — I350 Nonrheumatic aortic (valve) stenosis: Secondary | ICD-10-CM | POA: Diagnosis not present

## 2020-09-21 DIAGNOSIS — Z87891 Personal history of nicotine dependence: Secondary | ICD-10-CM | POA: Diagnosis not present

## 2020-09-30 ENCOUNTER — Ambulatory Visit: Payer: BC Managed Care – PPO

## 2020-10-05 DIAGNOSIS — Z01810 Encounter for preprocedural cardiovascular examination: Secondary | ICD-10-CM | POA: Diagnosis not present

## 2020-10-05 DIAGNOSIS — I350 Nonrheumatic aortic (valve) stenosis: Secondary | ICD-10-CM | POA: Diagnosis not present

## 2020-10-11 DIAGNOSIS — E785 Hyperlipidemia, unspecified: Secondary | ICD-10-CM | POA: Diagnosis not present

## 2020-10-11 DIAGNOSIS — Z978 Presence of other specified devices: Secondary | ICD-10-CM | POA: Diagnosis not present

## 2020-10-11 DIAGNOSIS — I2510 Atherosclerotic heart disease of native coronary artery without angina pectoris: Secondary | ICD-10-CM | POA: Diagnosis not present

## 2020-10-11 DIAGNOSIS — M5136 Other intervertebral disc degeneration, lumbar region: Secondary | ICD-10-CM | POA: Diagnosis not present

## 2020-10-11 DIAGNOSIS — I4892 Unspecified atrial flutter: Secondary | ICD-10-CM | POA: Diagnosis not present

## 2020-10-11 DIAGNOSIS — I350 Nonrheumatic aortic (valve) stenosis: Secondary | ICD-10-CM | POA: Diagnosis not present

## 2020-10-11 DIAGNOSIS — Z888 Allergy status to other drugs, medicaments and biological substances status: Secondary | ICD-10-CM | POA: Diagnosis not present

## 2020-10-11 DIAGNOSIS — J45909 Unspecified asthma, uncomplicated: Secondary | ICD-10-CM | POA: Diagnosis not present

## 2020-10-11 DIAGNOSIS — Z87891 Personal history of nicotine dependence: Secondary | ICD-10-CM | POA: Diagnosis not present

## 2020-10-11 DIAGNOSIS — Z781 Physical restraint status: Secondary | ICD-10-CM | POA: Diagnosis not present

## 2020-10-11 DIAGNOSIS — J90 Pleural effusion, not elsewhere classified: Secondary | ICD-10-CM | POA: Diagnosis not present

## 2020-10-11 DIAGNOSIS — I080 Rheumatic disorders of both mitral and aortic valves: Secondary | ICD-10-CM | POA: Diagnosis not present

## 2020-10-11 DIAGNOSIS — I4891 Unspecified atrial fibrillation: Secondary | ICD-10-CM | POA: Diagnosis not present

## 2020-10-11 DIAGNOSIS — J9811 Atelectasis: Secondary | ICD-10-CM | POA: Diagnosis not present

## 2020-10-11 DIAGNOSIS — I10 Essential (primary) hypertension: Secondary | ICD-10-CM | POA: Diagnosis not present

## 2020-10-11 DIAGNOSIS — Z952 Presence of prosthetic heart valve: Secondary | ICD-10-CM | POA: Diagnosis not present

## 2020-10-11 DIAGNOSIS — I440 Atrioventricular block, first degree: Secondary | ICD-10-CM | POA: Diagnosis not present

## 2020-10-11 DIAGNOSIS — E119 Type 2 diabetes mellitus without complications: Secondary | ICD-10-CM | POA: Diagnosis not present

## 2020-10-11 DIAGNOSIS — D62 Acute posthemorrhagic anemia: Secondary | ICD-10-CM | POA: Diagnosis not present

## 2020-10-11 DIAGNOSIS — Z79899 Other long term (current) drug therapy: Secondary | ICD-10-CM | POA: Diagnosis not present

## 2020-10-11 DIAGNOSIS — I97190 Other postprocedural cardiac functional disturbances following cardiac surgery: Secondary | ICD-10-CM | POA: Diagnosis not present

## 2020-10-11 DIAGNOSIS — G8929 Other chronic pain: Secondary | ICD-10-CM | POA: Diagnosis not present

## 2020-10-20 ENCOUNTER — Inpatient Hospital Stay: Payer: BC Managed Care – PPO | Admitting: Family Medicine

## 2020-10-22 DIAGNOSIS — Z7901 Long term (current) use of anticoagulants: Secondary | ICD-10-CM | POA: Diagnosis not present

## 2020-10-22 DIAGNOSIS — I350 Nonrheumatic aortic (valve) stenosis: Secondary | ICD-10-CM | POA: Diagnosis not present

## 2020-10-22 DIAGNOSIS — I952 Hypotension due to drugs: Secondary | ICD-10-CM | POA: Diagnosis not present

## 2020-10-22 DIAGNOSIS — Z7982 Long term (current) use of aspirin: Secondary | ICD-10-CM | POA: Diagnosis not present

## 2020-10-22 DIAGNOSIS — Z952 Presence of prosthetic heart valve: Secondary | ICD-10-CM | POA: Diagnosis not present

## 2020-10-22 DIAGNOSIS — Z79899 Other long term (current) drug therapy: Secondary | ICD-10-CM | POA: Diagnosis not present

## 2020-10-22 DIAGNOSIS — R918 Other nonspecific abnormal finding of lung field: Secondary | ICD-10-CM | POA: Diagnosis not present

## 2020-10-22 DIAGNOSIS — J479 Bronchiectasis, uncomplicated: Secondary | ICD-10-CM | POA: Diagnosis not present

## 2020-10-22 DIAGNOSIS — R55 Syncope and collapse: Secondary | ICD-10-CM | POA: Diagnosis not present

## 2020-10-22 DIAGNOSIS — I4891 Unspecified atrial fibrillation: Secondary | ICD-10-CM | POA: Diagnosis not present

## 2020-10-22 DIAGNOSIS — T447X5A Adverse effect of beta-adrenoreceptor antagonists, initial encounter: Secondary | ICD-10-CM | POA: Diagnosis not present

## 2020-10-22 DIAGNOSIS — Z87891 Personal history of nicotine dependence: Secondary | ICD-10-CM | POA: Diagnosis not present

## 2020-10-22 DIAGNOSIS — Z7951 Long term (current) use of inhaled steroids: Secondary | ICD-10-CM | POA: Diagnosis not present

## 2020-10-22 DIAGNOSIS — J45909 Unspecified asthma, uncomplicated: Secondary | ICD-10-CM | POA: Diagnosis not present

## 2020-12-07 DIAGNOSIS — Z79899 Other long term (current) drug therapy: Secondary | ICD-10-CM | POA: Diagnosis not present

## 2020-12-07 DIAGNOSIS — Z87891 Personal history of nicotine dependence: Secondary | ICD-10-CM | POA: Diagnosis not present

## 2020-12-07 DIAGNOSIS — Z7901 Long term (current) use of anticoagulants: Secondary | ICD-10-CM | POA: Diagnosis not present

## 2020-12-07 DIAGNOSIS — Z952 Presence of prosthetic heart valve: Secondary | ICD-10-CM | POA: Diagnosis not present

## 2020-12-09 DIAGNOSIS — M545 Low back pain, unspecified: Secondary | ICD-10-CM | POA: Diagnosis not present

## 2020-12-09 DIAGNOSIS — Z125 Encounter for screening for malignant neoplasm of prostate: Secondary | ICD-10-CM | POA: Diagnosis not present

## 2020-12-09 DIAGNOSIS — J339 Nasal polyp, unspecified: Secondary | ICD-10-CM | POA: Diagnosis not present

## 2020-12-09 DIAGNOSIS — I350 Nonrheumatic aortic (valve) stenosis: Secondary | ICD-10-CM | POA: Diagnosis not present

## 2020-12-09 DIAGNOSIS — G8929 Other chronic pain: Secondary | ICD-10-CM | POA: Diagnosis not present

## 2020-12-13 DIAGNOSIS — I440 Atrioventricular block, first degree: Secondary | ICD-10-CM | POA: Diagnosis not present

## 2020-12-15 DIAGNOSIS — Z952 Presence of prosthetic heart valve: Secondary | ICD-10-CM | POA: Diagnosis not present

## 2020-12-15 DIAGNOSIS — Z87891 Personal history of nicotine dependence: Secondary | ICD-10-CM | POA: Diagnosis not present

## 2020-12-15 DIAGNOSIS — Z7901 Long term (current) use of anticoagulants: Secondary | ICD-10-CM | POA: Diagnosis not present

## 2020-12-15 DIAGNOSIS — Z79899 Other long term (current) drug therapy: Secondary | ICD-10-CM | POA: Diagnosis not present

## 2020-12-16 DIAGNOSIS — Z79899 Other long term (current) drug therapy: Secondary | ICD-10-CM | POA: Diagnosis not present

## 2020-12-16 DIAGNOSIS — Z7901 Long term (current) use of anticoagulants: Secondary | ICD-10-CM | POA: Diagnosis not present

## 2020-12-16 DIAGNOSIS — Z952 Presence of prosthetic heart valve: Secondary | ICD-10-CM | POA: Diagnosis not present

## 2020-12-16 DIAGNOSIS — Z87891 Personal history of nicotine dependence: Secondary | ICD-10-CM | POA: Diagnosis not present

## 2020-12-22 DIAGNOSIS — Z87891 Personal history of nicotine dependence: Secondary | ICD-10-CM | POA: Diagnosis not present

## 2020-12-22 DIAGNOSIS — Z7901 Long term (current) use of anticoagulants: Secondary | ICD-10-CM | POA: Diagnosis not present

## 2020-12-22 DIAGNOSIS — Z79899 Other long term (current) drug therapy: Secondary | ICD-10-CM | POA: Diagnosis not present

## 2020-12-22 DIAGNOSIS — Z952 Presence of prosthetic heart valve: Secondary | ICD-10-CM | POA: Diagnosis not present

## 2020-12-23 DIAGNOSIS — Z87891 Personal history of nicotine dependence: Secondary | ICD-10-CM | POA: Diagnosis not present

## 2020-12-23 DIAGNOSIS — Z952 Presence of prosthetic heart valve: Secondary | ICD-10-CM | POA: Diagnosis not present

## 2020-12-23 DIAGNOSIS — Z7901 Long term (current) use of anticoagulants: Secondary | ICD-10-CM | POA: Diagnosis not present

## 2020-12-23 DIAGNOSIS — Z79899 Other long term (current) drug therapy: Secondary | ICD-10-CM | POA: Diagnosis not present

## 2020-12-27 DIAGNOSIS — Z79899 Other long term (current) drug therapy: Secondary | ICD-10-CM | POA: Diagnosis not present

## 2020-12-27 DIAGNOSIS — Z7901 Long term (current) use of anticoagulants: Secondary | ICD-10-CM | POA: Diagnosis not present

## 2020-12-27 DIAGNOSIS — Z87891 Personal history of nicotine dependence: Secondary | ICD-10-CM | POA: Diagnosis not present

## 2020-12-27 DIAGNOSIS — Z952 Presence of prosthetic heart valve: Secondary | ICD-10-CM | POA: Diagnosis not present

## 2020-12-29 DIAGNOSIS — Z87891 Personal history of nicotine dependence: Secondary | ICD-10-CM | POA: Diagnosis not present

## 2020-12-29 DIAGNOSIS — Z79899 Other long term (current) drug therapy: Secondary | ICD-10-CM | POA: Diagnosis not present

## 2020-12-29 DIAGNOSIS — Z7901 Long term (current) use of anticoagulants: Secondary | ICD-10-CM | POA: Diagnosis not present

## 2020-12-29 DIAGNOSIS — Z952 Presence of prosthetic heart valve: Secondary | ICD-10-CM | POA: Diagnosis not present

## 2020-12-30 DIAGNOSIS — Z7901 Long term (current) use of anticoagulants: Secondary | ICD-10-CM | POA: Diagnosis not present

## 2020-12-30 DIAGNOSIS — Z79899 Other long term (current) drug therapy: Secondary | ICD-10-CM | POA: Diagnosis not present

## 2020-12-30 DIAGNOSIS — I350 Nonrheumatic aortic (valve) stenosis: Secondary | ICD-10-CM | POA: Diagnosis not present

## 2020-12-30 DIAGNOSIS — R001 Bradycardia, unspecified: Secondary | ICD-10-CM | POA: Diagnosis not present

## 2020-12-30 DIAGNOSIS — I440 Atrioventricular block, first degree: Secondary | ICD-10-CM | POA: Diagnosis not present

## 2020-12-30 DIAGNOSIS — Z952 Presence of prosthetic heart valve: Secondary | ICD-10-CM | POA: Diagnosis not present

## 2020-12-30 DIAGNOSIS — Z87891 Personal history of nicotine dependence: Secondary | ICD-10-CM | POA: Diagnosis not present

## 2020-12-30 DIAGNOSIS — I480 Paroxysmal atrial fibrillation: Secondary | ICD-10-CM | POA: Diagnosis not present

## 2021-01-03 DIAGNOSIS — Z79899 Other long term (current) drug therapy: Secondary | ICD-10-CM | POA: Diagnosis not present

## 2021-01-03 DIAGNOSIS — Z87891 Personal history of nicotine dependence: Secondary | ICD-10-CM | POA: Diagnosis not present

## 2021-01-03 DIAGNOSIS — Z7901 Long term (current) use of anticoagulants: Secondary | ICD-10-CM | POA: Diagnosis not present

## 2021-01-03 DIAGNOSIS — Z952 Presence of prosthetic heart valve: Secondary | ICD-10-CM | POA: Diagnosis not present

## 2021-01-05 DIAGNOSIS — Z79899 Other long term (current) drug therapy: Secondary | ICD-10-CM | POA: Diagnosis not present

## 2021-01-05 DIAGNOSIS — Z87891 Personal history of nicotine dependence: Secondary | ICD-10-CM | POA: Diagnosis not present

## 2021-01-05 DIAGNOSIS — Z952 Presence of prosthetic heart valve: Secondary | ICD-10-CM | POA: Diagnosis not present

## 2021-01-05 DIAGNOSIS — Z7901 Long term (current) use of anticoagulants: Secondary | ICD-10-CM | POA: Diagnosis not present

## 2021-01-06 DIAGNOSIS — Z79899 Other long term (current) drug therapy: Secondary | ICD-10-CM | POA: Diagnosis not present

## 2021-01-06 DIAGNOSIS — Z952 Presence of prosthetic heart valve: Secondary | ICD-10-CM | POA: Diagnosis not present

## 2021-01-06 DIAGNOSIS — Z87891 Personal history of nicotine dependence: Secondary | ICD-10-CM | POA: Diagnosis not present

## 2021-01-06 DIAGNOSIS — Z7901 Long term (current) use of anticoagulants: Secondary | ICD-10-CM | POA: Diagnosis not present

## 2021-01-10 DIAGNOSIS — Z79899 Other long term (current) drug therapy: Secondary | ICD-10-CM | POA: Diagnosis not present

## 2021-01-10 DIAGNOSIS — Z952 Presence of prosthetic heart valve: Secondary | ICD-10-CM | POA: Diagnosis not present

## 2021-01-10 DIAGNOSIS — Z7901 Long term (current) use of anticoagulants: Secondary | ICD-10-CM | POA: Diagnosis not present

## 2021-01-10 DIAGNOSIS — Z87891 Personal history of nicotine dependence: Secondary | ICD-10-CM | POA: Diagnosis not present

## 2021-01-12 DIAGNOSIS — Z79899 Other long term (current) drug therapy: Secondary | ICD-10-CM | POA: Diagnosis not present

## 2021-01-12 DIAGNOSIS — Z87891 Personal history of nicotine dependence: Secondary | ICD-10-CM | POA: Diagnosis not present

## 2021-01-12 DIAGNOSIS — Z952 Presence of prosthetic heart valve: Secondary | ICD-10-CM | POA: Diagnosis not present

## 2021-01-12 DIAGNOSIS — Z7901 Long term (current) use of anticoagulants: Secondary | ICD-10-CM | POA: Diagnosis not present

## 2021-01-13 DIAGNOSIS — Z87891 Personal history of nicotine dependence: Secondary | ICD-10-CM | POA: Diagnosis not present

## 2021-01-13 DIAGNOSIS — Z952 Presence of prosthetic heart valve: Secondary | ICD-10-CM | POA: Diagnosis not present

## 2021-01-13 DIAGNOSIS — Z7901 Long term (current) use of anticoagulants: Secondary | ICD-10-CM | POA: Diagnosis not present

## 2021-01-13 DIAGNOSIS — Z79899 Other long term (current) drug therapy: Secondary | ICD-10-CM | POA: Diagnosis not present

## 2021-01-17 DIAGNOSIS — Z952 Presence of prosthetic heart valve: Secondary | ICD-10-CM | POA: Diagnosis not present

## 2021-01-17 DIAGNOSIS — Z7901 Long term (current) use of anticoagulants: Secondary | ICD-10-CM | POA: Diagnosis not present

## 2021-01-17 DIAGNOSIS — Z87891 Personal history of nicotine dependence: Secondary | ICD-10-CM | POA: Diagnosis not present

## 2021-01-17 DIAGNOSIS — Z79899 Other long term (current) drug therapy: Secondary | ICD-10-CM | POA: Diagnosis not present

## 2021-01-19 ENCOUNTER — Telehealth: Payer: Self-pay | Admitting: *Deleted

## 2021-01-19 DIAGNOSIS — Z48812 Encounter for surgical aftercare following surgery on the circulatory system: Secondary | ICD-10-CM | POA: Diagnosis not present

## 2021-01-19 DIAGNOSIS — Z952 Presence of prosthetic heart valve: Secondary | ICD-10-CM | POA: Diagnosis not present

## 2021-01-19 NOTE — Telephone Encounter (Signed)
Spoke with pt tried to get him set up for follow up.  He stated he will call back when he gets better he is still recovering from surgery last year.

## 2021-01-20 DIAGNOSIS — Z5181 Encounter for therapeutic drug level monitoring: Secondary | ICD-10-CM | POA: Diagnosis not present

## 2021-01-20 DIAGNOSIS — I4891 Unspecified atrial fibrillation: Secondary | ICD-10-CM | POA: Diagnosis not present

## 2021-01-20 DIAGNOSIS — Z48812 Encounter for surgical aftercare following surgery on the circulatory system: Secondary | ICD-10-CM | POA: Diagnosis not present

## 2021-01-20 DIAGNOSIS — Z7901 Long term (current) use of anticoagulants: Secondary | ICD-10-CM | POA: Diagnosis not present

## 2021-01-20 DIAGNOSIS — Z952 Presence of prosthetic heart valve: Secondary | ICD-10-CM | POA: Diagnosis not present

## 2021-01-24 DIAGNOSIS — Z952 Presence of prosthetic heart valve: Secondary | ICD-10-CM | POA: Diagnosis not present

## 2021-01-24 DIAGNOSIS — Z48812 Encounter for surgical aftercare following surgery on the circulatory system: Secondary | ICD-10-CM | POA: Diagnosis not present

## 2021-01-26 DIAGNOSIS — Z48812 Encounter for surgical aftercare following surgery on the circulatory system: Secondary | ICD-10-CM | POA: Diagnosis not present

## 2021-01-26 DIAGNOSIS — Z952 Presence of prosthetic heart valve: Secondary | ICD-10-CM | POA: Diagnosis not present

## 2021-02-02 DIAGNOSIS — Z48812 Encounter for surgical aftercare following surgery on the circulatory system: Secondary | ICD-10-CM | POA: Diagnosis not present

## 2021-02-02 DIAGNOSIS — Z952 Presence of prosthetic heart valve: Secondary | ICD-10-CM | POA: Diagnosis not present

## 2021-02-03 DIAGNOSIS — Z48812 Encounter for surgical aftercare following surgery on the circulatory system: Secondary | ICD-10-CM | POA: Diagnosis not present

## 2021-02-03 DIAGNOSIS — Z952 Presence of prosthetic heart valve: Secondary | ICD-10-CM | POA: Diagnosis not present

## 2021-02-07 DIAGNOSIS — Z952 Presence of prosthetic heart valve: Secondary | ICD-10-CM | POA: Diagnosis not present

## 2021-02-07 DIAGNOSIS — Z48812 Encounter for surgical aftercare following surgery on the circulatory system: Secondary | ICD-10-CM | POA: Diagnosis not present

## 2021-02-08 DIAGNOSIS — Z7901 Long term (current) use of anticoagulants: Secondary | ICD-10-CM | POA: Diagnosis not present

## 2021-02-08 DIAGNOSIS — R0609 Other forms of dyspnea: Secondary | ICD-10-CM | POA: Diagnosis not present

## 2021-02-08 DIAGNOSIS — I4891 Unspecified atrial fibrillation: Secondary | ICD-10-CM | POA: Diagnosis not present

## 2021-02-09 DIAGNOSIS — Z48812 Encounter for surgical aftercare following surgery on the circulatory system: Secondary | ICD-10-CM | POA: Diagnosis not present

## 2021-02-09 DIAGNOSIS — Z952 Presence of prosthetic heart valve: Secondary | ICD-10-CM | POA: Diagnosis not present

## 2021-02-10 DIAGNOSIS — Z952 Presence of prosthetic heart valve: Secondary | ICD-10-CM | POA: Diagnosis not present

## 2021-02-10 DIAGNOSIS — Z48812 Encounter for surgical aftercare following surgery on the circulatory system: Secondary | ICD-10-CM | POA: Diagnosis not present

## 2021-02-14 DIAGNOSIS — Z48812 Encounter for surgical aftercare following surgery on the circulatory system: Secondary | ICD-10-CM | POA: Diagnosis not present

## 2021-02-14 DIAGNOSIS — Z952 Presence of prosthetic heart valve: Secondary | ICD-10-CM | POA: Diagnosis not present

## 2021-02-16 ENCOUNTER — Telehealth: Payer: Self-pay

## 2021-02-16 DIAGNOSIS — Z48812 Encounter for surgical aftercare following surgery on the circulatory system: Secondary | ICD-10-CM | POA: Diagnosis not present

## 2021-02-16 DIAGNOSIS — Z952 Presence of prosthetic heart valve: Secondary | ICD-10-CM | POA: Diagnosis not present

## 2021-02-16 NOTE — Telephone Encounter (Signed)
NOTES ON FILE FROM [facility] [phone] REFERRAL TO SCHEDULING

## 2021-03-29 ENCOUNTER — Inpatient Hospital Stay: Payer: BC Managed Care – PPO | Admitting: Family Medicine

## 2021-05-30 ENCOUNTER — Inpatient Hospital Stay: Payer: BC Managed Care – PPO | Admitting: Family Medicine

## 2021-12-06 ENCOUNTER — Encounter: Payer: Self-pay | Admitting: Family Medicine

## 2021-12-06 ENCOUNTER — Ambulatory Visit (INDEPENDENT_AMBULATORY_CARE_PROVIDER_SITE_OTHER): Payer: BC Managed Care – PPO | Admitting: Family Medicine

## 2021-12-06 VITALS — BP 142/70 | HR 62 | Temp 98.0°F | Ht 67.0 in | Wt 184.0 lb

## 2021-12-06 DIAGNOSIS — R6889 Other general symptoms and signs: Secondary | ICD-10-CM

## 2021-12-06 LAB — POCT INFLUENZA A/B
Influenza A, POC: NEGATIVE
Influenza B, POC: NEGATIVE

## 2021-12-06 LAB — POC COVID19 BINAXNOW: SARS Coronavirus 2 Ag: NEGATIVE

## 2021-12-06 MED ORDER — BENZONATATE 200 MG PO CAPS: 200.0000 mg | ORAL_CAPSULE | Freq: Two times a day (BID) | ORAL | 0 refills | Status: DC | PRN

## 2021-12-06 MED ORDER — OSELTAMIVIR PHOSPHATE 75 MG PO CAPS: 75.0000 mg | ORAL_CAPSULE | Freq: Two times a day (BID) | ORAL | 0 refills | Status: AC

## 2021-12-06 MED ORDER — METHYLPREDNISOLONE ACETATE 80 MG/ML IJ SUSP
80.0000 mg | Freq: Once | INTRAMUSCULAR | Status: AC
  Administered 2021-12-06: 80 mg via INTRAMUSCULAR

## 2021-12-06 NOTE — Progress Notes (Addendum)
Chief Complaint  Patient presents with   Cough    Congestion Tested negative for Covid Body aches     Jay Hawkins here for URI complaints.  Duration: 1 day  Associated symptoms: subjective fever, chest tightness, myalgia, and coughing Denies: sinus congestion, sinus pain, rhinorrhea, itchy watery eyes, ear pain, ear drainage, sore throat, wheezing, shortness of breath, and fevers, N/V/D, loss of taste/smell Treatment to date: none Sick contacts: No Tested neg for covid this AM.  Past Medical History:  Diagnosis Date   Allergy    Asthma    Colon polyps    GERD (gastroesophageal reflux disease)    Heart murmur    Hyperlipidemia    Hypertension    Tendonitis     Objective BP (!) 142/70    Pulse 62    Temp 98 F (36.7 C) (Oral)    Ht 5\' 7"  (1.702 m)    Wt 184 lb (83.5 kg)    SpO2 96%    BMI 28.82 kg/m  General: Awake, alert, appears stated age HEENT: AT, [place], ears patent b/l and TM's neg, nares patent w/o discharge, pharynx pink and without exudates, MMM Neck: No masses or asymmetry Heart: RRR Lungs: CTAB, no accessory muscle use Psych: Age appropriate judgment and insight, normal mood and affect  Flu-like symptoms - Plan: benzonatate (TESSALON) 200 MG capsule, methylPREDNISolone acetate (DEPO-MEDROL) injection 80 mg, POCT Influenza A/B, POC COVID-19, oseltamivir (TAMIFLU) 75 MG capsule  Continue to push fluids, practice good hand hygiene, cover mouth when coughing. Benzonatate prn. Tamiflu empiric tx.  F/u prn. If starting to experience irreplaceable fluid loss, shaking, or shortness of breath, seek immediate care. Pt voiced understanding and agreement to the plan.  [place], DO 12/06/21 12:19 PM

## 2021-12-06 NOTE — Addendum Note (Signed)
Addended by: Radene Gunning on: 12/06/2021 12:19 PM   Modules accepted: Orders

## 2021-12-06 NOTE — Patient Instructions (Signed)
OK to take Tylenol 1000 mg (2 extra strength tabs) or 975 mg (3 regular strength tabs) every 6 hours as needed.  Continue to push fluids, practice good hand hygiene, and cover your mouth if you cough.  If you start having fevers, shaking or shortness of breath, seek immediate care.  Let us know if you need anything. 

## 2021-12-07 ENCOUNTER — Encounter: Payer: Self-pay | Admitting: Family Medicine

## 2022-09-29 DIAGNOSIS — Z0189 Encounter for other specified special examinations: Secondary | ICD-10-CM | POA: Diagnosis not present

## 2022-10-25 DIAGNOSIS — R918 Other nonspecific abnormal finding of lung field: Secondary | ICD-10-CM | POA: Diagnosis not present

## 2022-11-10 DIAGNOSIS — Z86010 Personal history of colonic polyps: Secondary | ICD-10-CM | POA: Diagnosis not present

## 2022-11-10 DIAGNOSIS — K219 Gastro-esophageal reflux disease without esophagitis: Secondary | ICD-10-CM | POA: Diagnosis not present

## 2022-11-10 DIAGNOSIS — Z0189 Encounter for other specified special examinations: Secondary | ICD-10-CM | POA: Diagnosis not present

## 2022-11-10 DIAGNOSIS — Z800 Family history of malignant neoplasm of digestive organs: Secondary | ICD-10-CM | POA: Diagnosis not present

## 2022-11-23 DIAGNOSIS — Z87891 Personal history of nicotine dependence: Secondary | ICD-10-CM | POA: Diagnosis not present

## 2023-01-02 DIAGNOSIS — R1310 Dysphagia, unspecified: Secondary | ICD-10-CM | POA: Diagnosis not present

## 2023-01-03 DIAGNOSIS — R0602 Shortness of breath: Secondary | ICD-10-CM | POA: Diagnosis not present

## 2023-01-03 DIAGNOSIS — J069 Acute upper respiratory infection, unspecified: Secondary | ICD-10-CM | POA: Diagnosis not present

## 2023-01-05 DIAGNOSIS — Z45010 Encounter for checking and testing of cardiac pacemaker pulse generator [battery]: Secondary | ICD-10-CM | POA: Diagnosis not present

## 2023-01-05 DIAGNOSIS — Z0389 Encounter for observation for other suspected diseases and conditions ruled out: Secondary | ICD-10-CM | POA: Diagnosis not present

## 2023-01-05 DIAGNOSIS — Z7901 Long term (current) use of anticoagulants: Secondary | ICD-10-CM | POA: Diagnosis not present

## 2023-01-05 DIAGNOSIS — I350 Nonrheumatic aortic (valve) stenosis: Secondary | ICD-10-CM | POA: Diagnosis not present

## 2023-01-05 DIAGNOSIS — I480 Paroxysmal atrial fibrillation: Secondary | ICD-10-CM | POA: Diagnosis not present

## 2023-01-05 DIAGNOSIS — I2510 Atherosclerotic heart disease of native coronary artery without angina pectoris: Secondary | ICD-10-CM | POA: Diagnosis not present

## 2023-01-05 DIAGNOSIS — R918 Other nonspecific abnormal finding of lung field: Secondary | ICD-10-CM | POA: Diagnosis not present

## 2023-01-05 DIAGNOSIS — I495 Sick sinus syndrome: Secondary | ICD-10-CM | POA: Diagnosis not present

## 2023-01-08 DIAGNOSIS — J209 Acute bronchitis, unspecified: Secondary | ICD-10-CM | POA: Diagnosis not present

## 2023-02-16 DIAGNOSIS — J984 Other disorders of lung: Secondary | ICD-10-CM | POA: Diagnosis not present

## 2023-03-30 DIAGNOSIS — Z0189 Encounter for other specified special examinations: Secondary | ICD-10-CM | POA: Diagnosis not present

## 2023-03-30 DIAGNOSIS — M1711 Unilateral primary osteoarthritis, right knee: Secondary | ICD-10-CM | POA: Diagnosis not present

## 2023-04-13 DIAGNOSIS — K5730 Diverticulosis of large intestine without perforation or abscess without bleeding: Secondary | ICD-10-CM | POA: Diagnosis not present

## 2023-04-13 DIAGNOSIS — K3189 Other diseases of stomach and duodenum: Secondary | ICD-10-CM | POA: Diagnosis not present

## 2023-04-13 DIAGNOSIS — K640 First degree hemorrhoids: Secondary | ICD-10-CM | POA: Diagnosis not present

## 2023-04-13 DIAGNOSIS — K31A19 Gastric intestinal metaplasia without dysplasia, unspecified site: Secondary | ICD-10-CM | POA: Diagnosis not present

## 2023-04-13 DIAGNOSIS — Z800 Family history of malignant neoplasm of digestive organs: Secondary | ICD-10-CM | POA: Diagnosis not present

## 2023-04-13 DIAGNOSIS — K2281 Esophageal polyp: Secondary | ICD-10-CM | POA: Diagnosis not present

## 2023-04-13 DIAGNOSIS — K6389 Other specified diseases of intestine: Secondary | ICD-10-CM | POA: Diagnosis not present

## 2023-04-13 DIAGNOSIS — K219 Gastro-esophageal reflux disease without esophagitis: Secondary | ICD-10-CM | POA: Diagnosis not present

## 2023-04-13 DIAGNOSIS — K222 Esophageal obstruction: Secondary | ICD-10-CM | POA: Diagnosis not present

## 2023-04-13 DIAGNOSIS — K2950 Unspecified chronic gastritis without bleeding: Secondary | ICD-10-CM | POA: Diagnosis not present

## 2023-04-13 DIAGNOSIS — K635 Polyp of colon: Secondary | ICD-10-CM | POA: Diagnosis not present

## 2023-04-13 DIAGNOSIS — Z8719 Personal history of other diseases of the digestive system: Secondary | ICD-10-CM | POA: Diagnosis not present

## 2023-04-13 DIAGNOSIS — Z1211 Encounter for screening for malignant neoplasm of colon: Secondary | ICD-10-CM | POA: Diagnosis not present

## 2023-04-20 DIAGNOSIS — M25561 Pain in right knee: Secondary | ICD-10-CM | POA: Diagnosis not present

## 2023-05-15 DIAGNOSIS — I7300 Raynaud's syndrome without gangrene: Secondary | ICD-10-CM | POA: Diagnosis not present

## 2023-05-15 DIAGNOSIS — M25561 Pain in right knee: Secondary | ICD-10-CM | POA: Diagnosis not present

## 2023-06-14 DIAGNOSIS — I739 Peripheral vascular disease, unspecified: Secondary | ICD-10-CM | POA: Diagnosis not present

## 2023-07-26 DIAGNOSIS — M25549 Pain in joints of unspecified hand: Secondary | ICD-10-CM | POA: Diagnosis not present

## 2023-09-12 DIAGNOSIS — Z952 Presence of prosthetic heart valve: Secondary | ICD-10-CM | POA: Diagnosis not present

## 2023-09-12 DIAGNOSIS — Z7901 Long term (current) use of anticoagulants: Secondary | ICD-10-CM | POA: Diagnosis not present

## 2023-09-12 DIAGNOSIS — R0609 Other forms of dyspnea: Secondary | ICD-10-CM | POA: Diagnosis not present

## 2023-09-12 DIAGNOSIS — I7300 Raynaud's syndrome without gangrene: Secondary | ICD-10-CM | POA: Diagnosis not present

## 2023-09-27 DIAGNOSIS — R918 Other nonspecific abnormal finding of lung field: Secondary | ICD-10-CM | POA: Diagnosis not present

## 2023-09-27 DIAGNOSIS — Z0189 Encounter for other specified special examinations: Secondary | ICD-10-CM | POA: Diagnosis not present

## 2023-09-27 DIAGNOSIS — I350 Nonrheumatic aortic (valve) stenosis: Secondary | ICD-10-CM | POA: Diagnosis not present

## 2023-09-27 DIAGNOSIS — Z23 Encounter for immunization: Secondary | ICD-10-CM | POA: Diagnosis not present

## 2023-09-27 DIAGNOSIS — I4891 Unspecified atrial fibrillation: Secondary | ICD-10-CM | POA: Diagnosis not present

## 2023-09-27 DIAGNOSIS — J339 Nasal polyp, unspecified: Secondary | ICD-10-CM | POA: Diagnosis not present

## 2023-10-18 ENCOUNTER — Encounter: Payer: Self-pay | Admitting: Family Medicine

## 2023-10-18 ENCOUNTER — Ambulatory Visit: Payer: BC Managed Care – PPO | Admitting: Family Medicine

## 2023-10-18 VITALS — BP 130/62 | HR 76 | Temp 97.2°F | Ht 67.0 in | Wt 185.0 lb

## 2023-10-18 DIAGNOSIS — R6889 Other general symptoms and signs: Secondary | ICD-10-CM

## 2023-10-18 DIAGNOSIS — R051 Acute cough: Secondary | ICD-10-CM

## 2023-10-18 LAB — POC COVID19 BINAXNOW: SARS Coronavirus 2 Ag: NEGATIVE

## 2023-10-18 MED ORDER — BENZONATATE 200 MG PO CAPS: 200.0000 mg | ORAL_CAPSULE | Freq: Two times a day (BID) | ORAL | 0 refills | Status: AC | PRN

## 2023-10-18 NOTE — Patient Instructions (Signed)
 Likely Viral Upper Respiratory Infection  Continue supportive measures including rest, hydration, humidifier use, steam showers, warm compresses to sinuses, warm liquids with lemon and honey, and over-the-counter cough, cold, and analgesics as needed. If symptoms persist 8-10 days, become severe, or return after a few days of feeling better, then please follow-up for repeat evaluation to determine if antibiotics may be necessary.  Over the counter medications that may be helpful for symptoms:  Guaifenesin 1200 mg extended release tabs twice daily, with plenty of water For cough and congestion Brand name: Mucinex   Pseudoephedrine 30 mg, one or two tabs every 4 to 6 hours For sinus congestion Brand name: Sudafed You must get this from the pharmacy counter.  Oxymetazoline nasal spray each morning, one spray in each nostril, for NO MORE THAN 3 days  For nasal and sinus congestion Brand name: Afrin Saline nasal spray or Saline Nasal Irrigation (Netti Pot, etc) 3-5 times a day For nasal and sinus congestion Brand names: Ocean or AYR Fluticasone nasal spray OR Mometasone nasal spray OR Triamcinolone Acetonide nasal spray - follow directions on the packaging For nasal and sinus congestion Brand name: Flonase, Nasonex, Nasacort Warm salt water gargles  For sore throat Every few hours as needed Alternate ibuprofen 400-600 mg and acetaminophen 1000 mg every 6 hours For fever, body aches, headache Brand names: Motrin or Advil and Tylenol Dextromethorphan 12-hour cough version 30 mg every 12 hours  For cough Brand name: Delsym Stop all other cold medications for now (Nyquil, Dayquil, Tylenol Cold, Theraflu, etc) and other non-prescription cough/cold preparations. Many of these have the same ingredients listed above and could cause an overdose of medication.   Herbal treatments that have been shown to be helpful in some patients include: Vitamin C 1000 mg per day Zinc 100 mg per day Quercetin  25-500 mg twice a day Melatonin 5-10mg  at bedtime Honey Green Tea  General Instructions Allow your body to rest Drink PLENTY of fluids Typically, we are the most contagious 1-2 days before symptoms start through the first 2-3 days of most severe symptoms. Per CDC guidelines, you can return to school/work when symptoms have started to improve and you have been fever-free for 24 hours. However, recommend you continue extra precautions for the following 5 days (frequent hand hygiene, masking, covering coughs/sneezes, minimize exposure to immunocompromised individuals, etc).  If you develop severe shortness of breath, uncontrolled fevers, coughing up blood, confusion, chest pain, or signs of dehydration (such as significantly decreased urine amounts or dizziness with standing) please go to the nearest ER.

## 2023-10-18 NOTE — Progress Notes (Signed)
Acute Office Visit  Subjective:     Patient ID: Jay Hawkins, male    DOB: February 28, 1956, 67 y.o.   MRN: [id number]  Chief Complaint  Patient presents with   Cough     Patient is in today for cough and URI symptoms.   Discussed the use of AI scribe software for clinical note transcription with the patient, who gave verbal consent to proceed.  History of Present Illness   The patient, with an unspecified medical history, has been experiencing a dry cough for the past 4-5 days. The cough is described as a tickling sensation in the throat, without any associated phlegm or sputum production. The patient denies any sinus pain or pressure but reports a mild headache. The cough is intermittent and more of a tickle than a persistent cough. The patient denies any fever, nausea, vomiting, diarrhea, or chest pain. There is no associated wheezing.  The patient has been around sick grandchildren, who had a cough, cold, and one of them had strep throat a week or two ago. The patient has been taking over-the-counter Robitussin cough syrup, cold and flu tablets, and Cepacol. These medications provide temporary relief.  [name] sleep is not disturbed by the cough.   The patient has been outdoors, raking leaves in the past week, which they speculate might have contributed to their current symptoms. They are due to fly to [facility] tomorrow and will be away for about a week.          All review of systems negative except what is listed in the HPI      Objective:    BP 130/62   Pulse 76   Temp (!) 97.2 F (36.2 C) (Oral)   Ht 5\' 7"  (1.702 m)   Wt 185 lb (83.9 kg)   SpO2 100%   BMI 28.98 kg/m    Physical Exam Vitals reviewed.  Constitutional:      General: He is not in acute distress.    Appearance: Normal appearance. He is not ill-appearing.  HENT:     Head: Normocephalic and atraumatic.     Nose: Congestion present.     Mouth/Throat:     Comments: Mild  cobblestoning/PND Eyes:     Conjunctiva/sclera: Conjunctivae normal.  Cardiovascular:     Rate and Rhythm: Normal rate and regular rhythm.  Pulmonary:     Effort: Pulmonary effort is normal.     Breath sounds: Normal breath sounds.  Musculoskeletal:     Cervical back: No tenderness.  Lymphadenopathy:     Cervical: No cervical adenopathy.  Skin:    General: Skin is warm and dry.  Neurological:     Mental Status: He is alert and oriented to person, place, and time.  Psychiatric:        Mood and Affect: Mood normal.        Behavior: Behavior normal.        Thought Content: Thought content normal.        Judgment: Judgment normal.     Results for orders placed or performed in visit on 10/18/23  POC COVID-19 BinaxNow  Result Value Ref Range   SARS Coronavirus 2 Ag Negative Negative        Assessment & Plan:   Problem List Items Addressed This Visit   None Visit Diagnoses     Acute cough    -  Primary   Relevant Orders   POC COVID-19 BinaxNow (Completed)   Flu-like symptoms  Relevant Medications   benzonatate (TESSALON) 200 MG capsule      Dry cough and tickling in throat for approximately 5 days. No fever, phlegm, sinus pain, or pressure. Mild headache. Exposure to sick grandchildren. No improvement with over-the-counter Robitussin, cold and flu tablets, and Cepacol. COVID test negative. -Prescribe Tessalon cough pearls. -Continue over-the-counter treatments including Mucinex and Flonase. -Encourage warm liquids. -If no improvement after the weekend, patient to notify the office. Continue supportive measures including rest, hydration, humidifier use, steam showers, warm compresses to sinuses, warm liquids with lemon and honey, and over-the-counter cough, cold, and analgesics as needed.  Patient aware of signs/symptoms requiring further/urgent evaluation.      Meds ordered this encounter  Medications   benzonatate (TESSALON) 200 MG capsule    Sig: Take 1  capsule (200 mg total) by mouth 2 (two) times daily as needed for cough.    Dispense:  20 capsule    Refill:  0    Order Specific Question:   Supervising Provider    Answer:   Danise Edge A [[id number]]    Return if symptoms worsen or fail to improve.  Clayborne Dana, NP

## 2023-10-31 DIAGNOSIS — K31A0 Gastric intestinal metaplasia, unspecified: Secondary | ICD-10-CM | POA: Diagnosis not present

## 2023-10-31 DIAGNOSIS — K219 Gastro-esophageal reflux disease without esophagitis: Secondary | ICD-10-CM | POA: Diagnosis not present

## 2023-10-31 DIAGNOSIS — Z800 Family history of malignant neoplasm of digestive organs: Secondary | ICD-10-CM | POA: Diagnosis not present

## 2023-10-31 DIAGNOSIS — Z23 Encounter for immunization: Secondary | ICD-10-CM | POA: Diagnosis not present

## 2023-10-31 DIAGNOSIS — Z8719 Personal history of other diseases of the digestive system: Secondary | ICD-10-CM | POA: Diagnosis not present

## 2023-11-07 DIAGNOSIS — H524 Presbyopia: Secondary | ICD-10-CM | POA: Diagnosis not present

## 2023-11-07 DIAGNOSIS — H04123 Dry eye syndrome of bilateral lacrimal glands: Secondary | ICD-10-CM | POA: Diagnosis not present

## 2023-11-07 DIAGNOSIS — H25813 Combined forms of age-related cataract, bilateral: Secondary | ICD-10-CM | POA: Diagnosis not present

## 2023-11-13 DIAGNOSIS — H53149 Visual discomfort, unspecified: Secondary | ICD-10-CM | POA: Diagnosis not present

## 2023-11-13 DIAGNOSIS — H524 Presbyopia: Secondary | ICD-10-CM | POA: Diagnosis not present

## 2023-11-14 DIAGNOSIS — H524 Presbyopia: Secondary | ICD-10-CM | POA: Diagnosis not present

## 2023-11-14 DIAGNOSIS — H53149 Visual discomfort, unspecified: Secondary | ICD-10-CM | POA: Diagnosis not present

## 2023-11-30 DIAGNOSIS — I480 Paroxysmal atrial fibrillation: Secondary | ICD-10-CM | POA: Diagnosis not present

## 2023-11-30 DIAGNOSIS — Z45010 Encounter for checking and testing of cardiac pacemaker pulse generator [battery]: Secondary | ICD-10-CM | POA: Diagnosis not present

## 2023-11-30 DIAGNOSIS — I493 Ventricular premature depolarization: Secondary | ICD-10-CM | POA: Diagnosis not present

## 2023-11-30 DIAGNOSIS — R0609 Other forms of dyspnea: Secondary | ICD-10-CM | POA: Diagnosis not present

## 2023-11-30 DIAGNOSIS — I495 Sick sinus syndrome: Secondary | ICD-10-CM | POA: Diagnosis not present

## 2023-11-30 DIAGNOSIS — I2510 Atherosclerotic heart disease of native coronary artery without angina pectoris: Secondary | ICD-10-CM | POA: Diagnosis not present

## 2023-11-30 DIAGNOSIS — I4719 Other supraventricular tachycardia: Secondary | ICD-10-CM | POA: Diagnosis not present

## 2023-12-07 ENCOUNTER — Ambulatory Visit (INDEPENDENT_AMBULATORY_CARE_PROVIDER_SITE_OTHER): Payer: BC Managed Care – PPO | Admitting: Medical

## 2023-12-07 ENCOUNTER — Ambulatory Visit (HOSPITAL_BASED_OUTPATIENT_CLINIC_OR_DEPARTMENT_OTHER)
Admission: RE | Admit: 2023-12-07 | Discharge: 2023-12-07 | Disposition: A | Discharge status: Home / Self Care | Payer: BC Managed Care – PPO | Source: Ambulatory Visit | Attending: Medical | Admitting: Medical

## 2023-12-07 VITALS — BP 132/66 | HR 78 | Resp 18 | Ht 67.0 in | Wt 189.8 lb

## 2023-12-07 DIAGNOSIS — M19041 Primary osteoarthritis, right hand: Secondary | ICD-10-CM | POA: Diagnosis not present

## 2023-12-07 DIAGNOSIS — R739 Hyperglycemia, unspecified: Secondary | ICD-10-CM | POA: Diagnosis not present

## 2023-12-07 DIAGNOSIS — M79641 Pain in right hand: Secondary | ICD-10-CM | POA: Insufficient documentation

## 2023-12-07 DIAGNOSIS — R944 Abnormal results of kidney function studies: Secondary | ICD-10-CM

## 2023-12-07 DIAGNOSIS — R768 Other specified abnormal immunological findings in serum: Secondary | ICD-10-CM

## 2023-12-07 DIAGNOSIS — M2550 Pain in unspecified joint: Secondary | ICD-10-CM

## 2023-12-07 DIAGNOSIS — M79642 Pain in left hand: Secondary | ICD-10-CM | POA: Diagnosis not present

## 2023-12-07 DIAGNOSIS — M65339 Trigger finger, unspecified middle finger: Secondary | ICD-10-CM

## 2023-12-07 DIAGNOSIS — R7689 Other specified abnormal immunological findings in serum: Secondary | ICD-10-CM

## 2023-12-07 DIAGNOSIS — M19042 Primary osteoarthritis, left hand: Secondary | ICD-10-CM | POA: Diagnosis not present

## 2023-12-07 NOTE — Progress Notes (Signed)
 Subjective:    Patient ID: Jay Hawkins, male    DOB: [birth year], 68 y.o.   MRN: [id number]  HPI Discussed the use of AI scribe software for clinical note transcription with the patient, who gave verbal consent to proceed.  History of Present Illness   The patient, with a history of heart surgery, presents with complaints of joint pain, specifically in both hands, with the left hand being more bothersome. He has been diagnosed with arthritis and Raynauds syndrome. The patient reports swelling and pain in the metacarpal joint and middle finger of the left hand, which has been causing difficulty in bending the finger. He also mentions a trigger lock in the left hand, causing discomfort and limited mobility. Similar symptoms are present in the right hand, but to a lesser extent.  The patient has been managing the pain with physical therapy exercises and the use of diclofenac gel, as prescribed by the [facility]. However, he reports that these interventions have not been significantly helpful. The patient also mentions having nodules in the hands, which have been increasing in size.  [name] symptoms are most severe in the morning, causing stiffness and necessitating physical therapy exercises upon waking. He denies experiencing joint pain in any other parts of the body, such as the elbows, knees, or ankles. The patient has been managing his heart condition with medication, including Eliquis and simvastatin. He also reports having a pacemaker.  [name] last blood work was done approximately a year ago, with no significant findings reported. He expresses a desire for further testing, such as x-rays and an inflammation panel, to better understand the cause of his symptoms.        Past Medical History:  Diagnosis Date   Allergy    Asthma    Colon polyps    GERD (gastroesophageal reflux disease)    Heart murmur    Hyperlipidemia    Hypertension    Tendonitis      Social History    Socioeconomic History   Marital status: Married    Spouse name: Not on file   Number of children: Not on file   Years of education: Not on file   Highest education level: Not on file  Occupational History   Not on file  Tobacco Use   Smoking status: Never   Smokeless tobacco: Never  Substance and Sexual Activity   Alcohol use: Yes    Alcohol/week: 2.0 standard drinks of alcohol    Types: 2 Glasses of wine per week   Drug use: No   Sexual activity: Not on file  Other Topics Concern   Not on file  Social History Narrative   Not on file   Social Drivers of Health   Financial Resource Strain: Not on file  Food Insecurity: Not on file  Transportation Needs: Not on file  Physical Activity: Not on file  Stress: Not on file  Social Connections: Unknown (01/01/2023)   Received from [facility], [facility]   Social Network    Social Network: Not on file  Intimate Partner Violence: Unknown (01/01/2023)   Received from [employer], [facility]   HITS    Physically Hurt: Not on file    Insult or Talk Down To: Not on file    Threaten Physical Harm: Not on file    Scream or Curse: Not on file    Past Surgical History:  Procedure Laterality Date   LEFT HEART CATHETERIZATION WITH CORONARY ANGIOGRAM N/A 12/11/2014  Procedure: LEFT HEART CATHETERIZATION WITH CORONARY ANGIOGRAM;  Surgeon: Debby DELENA Sor, MD;  Location: [facility] CATH LAB;  Service: Cardiovascular;  Laterality: N/A;   NASAL POLYP SURGERY     TONSILLECTOMY      Family History  Problem Relation Age of Onset   Cancer Mother    Hypertension Mother    Cancer Father     Allergies  Allergen Reactions   Niacin And Related Itching    Current Outpatient Medications on File Prior to Visit  Medication Sig Dispense Refill   albuterol  (VENTOLIN  HFA) 108 (90 Base) MCG/ACT inhaler Inhale 2 puffs into the lungs every 6 (six) hours as needed for wheezing or shortness of breath. 18 g 2   apixaban (ELIQUIS) 5 MG TABS  tablet Take 1 tablet by mouth 2 (two) times daily.     benzonatate  (TESSALON ) 200 MG capsule Take 1 capsule (200 mg total) by mouth 2 (two) times daily as needed for cough. 20 capsule 0   Cetirizine  HCl (ZYRTEC  ALLERGY) 10 MG TBDP Take 10 mg by mouth daily as needed. 30 tablet 0   fluticasone  (FLONASE ) 50 MCG/ACT nasal spray Place 2 sprays into both nostrils daily. 16 g 0   fluticasone  (FLOVENT  HFA) 110 MCG/ACT inhaler Inhale 2 puffs into the lungs in the morning and at bedtime. Rinse mouth out after using. 1 Inhaler 2   Multiple Vitamins-Minerals (THERA M PLUS PO) Take 1 tablet by mouth daily.     simvastatin (ZOCOR) 20 MG tablet Take 20 mg by mouth daily at 6 PM.     No current facility-administered medications on file prior to visit.    BP 132/66   Pulse 78   Resp 18   Ht 5' 7 (1.702 m)   Wt 189 lb 12.8 oz (86.1 kg)   SpO2 99%   BMI 29.73 kg/m       Review of Systems See hpi    Objective:   Physical Exam  General Mental Status- Alert. General Appearance- Not in acute distress.   Skin General: Color- Normal Color. Moisture- Normal Moisture.  Neck  No JVD.  Chest and Lung Exam Auscultation: Breath Sounds:-Normal.  Cardiovascular Auscultation:Rythm- Regular. Murmurs & Other Heart Sounds:Auscultation of the heart reveals- No Murmurs.   Neurologic Cranial Nerve exam:- CN III-XII intact(No nystagmus), symmetric smile. Strength:- 5/5 equal and symmetric strength both upper and lower extremities.   Hands- both sides appears to have swelling dip, pip and mcp joints. Left hand mild more swollen than rt side. Left middle finger mild triggering on flexion and extension.     Assessment & Plan:  Assessment and Plan    Patient Instructions  Artralgia Pain and swelling in both hands, worse in the left hand. Previous blood work was negative for inflammatory markers. Patient has been using diclofenac gel with limited relief. -Order x-rays of both hands. -Repeat blood  work including rheumatoid factor, sed rate, ANA, and C-reactive protein. -Consider referral to a private rheumatologist based on test results.  Trigger Finger (Left Hand) Patient reports inability to fully extend the third digit of the left hand, consistent with trigger finger. -Refer to a hand surgeon for further evaluation and potential treatment.  Decreased Kidney Function Patient has a history of decreased kidney function. -Order a metabolic panel and GFR to assess current kidney function.  Elevated blood sugar. Patient has a history of elevated A1C, but reports it has been controlled since weight loss. -Order A1C to assess current blood glucose control.  Post Heart  Surgery. Nov 2021. Patient reports onset of hand symptoms after heart surgery. Currently on Eliquis and simvastatin. -Continue current medications as prescribed.  Physical Therapy Patient has been doing exercises prescribed by a physical therapist at the [facility], but reports limited improvement. -Continue physical therapy exercises as able.   Follow up date to be determined after lab and imaging review.        Roshana Shuffield, PA-C

## 2023-12-07 NOTE — Patient Instructions (Addendum)
 Arthralgia Pain and swelling in both hands, worse in the left hand. Previous blood work was negative for inflammatory markers. Patient has been using diclofenac gel with limited relief. -Order x-rays of both hands. -Repeat blood work including rheumatoid factor, sed rate, ANA, and C-reactive protein. -Consider referral to a private rheumatologist based on test results.  Trigger Finger (Left Hand) Patient reports inability to fully extend the third digit of the left hand, consistent with trigger finger. -Refer to a hand surgeon for further evaluation and potential treatment.  Decreased Kidney Function Patient has a history of decreased kidney function. -Order a metabolic panel and GFR to assess current kidney function.  Elevated blood sugar. Patient has a history of elevated A1C, but reports it has been controlled since weight loss. -Order A1C to assess current blood glucose control.  Post Heart Surgery. Nov 2021. Patient reports onset of hand symptoms after heart surgery. Currently on Eliquis and simvastatin. -Continue current medications as prescribed.  Physical Therapy Patient has been doing exercises prescribed by a physical therapist at the [facility], but reports limited improvement. -Continue physical therapy exercises as able.   Follow up date to be determined after lab and imaging review.

## 2023-12-08 ENCOUNTER — Encounter: Payer: Self-pay | Admitting: Medical

## 2023-12-08 MED ORDER — METHYLPREDNISOLONE 4 MG PO TABS: ORAL_TABLET | ORAL | 0 refills | Status: AC

## 2023-12-08 NOTE — Addendum Note (Signed)
 Addended by: Gwenevere Abbot on: 12/08/2023 06:48 AM   Modules accepted: Orders

## 2023-12-09 LAB — COMPREHENSIVE METABOLIC PANEL
AG Ratio: 1.5 (calc) (ref 1.0–2.5)
ALT: 18 U/L (ref 9–46)
AST: 21 U/L (ref 10–35)
Albumin: 4.4 g/dL (ref 3.6–5.1)
Alkaline phosphatase (APISO): 70 U/L (ref 35–144)
BUN: 15 mg/dL (ref 7–25)
CO2: 26 mmol/L (ref 20–32)
Calcium: 9.2 mg/dL (ref 8.6–10.3)
Chloride: 104 mmol/L (ref 98–110)
Creat: 1.18 mg/dL (ref 0.70–1.35)
Globulin: 3 g/dL (ref 1.9–3.7)
Glucose, Bld: 98 mg/dL (ref 65–99)
Potassium: 3.8 mmol/L (ref 3.5–5.3)
Sodium: 140 mmol/L (ref 135–146)
Total Bilirubin: 0.3 mg/dL (ref 0.2–1.2)
Total Protein: 7.4 g/dL (ref 6.1–8.1)

## 2023-12-09 LAB — HEMOGLOBIN A1C
Hgb A1c MFr Bld: 6 %{Hb} — ABNORMAL HIGH (ref <5.7)
Mean Plasma Glucose: 126 mg/dL
eAG (mmol/L): 7 mmol/L

## 2023-12-09 LAB — ANTI-NUCLEAR AB-TITER (ANA TITER): ANA Titer 1: 1:80 {titer} — ABNORMAL HIGH

## 2023-12-09 LAB — C-REACTIVE PROTEIN: CRP: <3 mg/L (ref <8.0)

## 2023-12-09 LAB — RHEUMATOID FACTOR: Rheumatoid fact SerPl-aCnc: <10 [IU]/mL (ref <14)

## 2023-12-09 LAB — SEDIMENTATION RATE: Sed Rate: 11 mm/h (ref 0–20)

## 2023-12-09 LAB — ANA: Anti Nuclear Antibody (ANA): POSITIVE — AB

## 2023-12-09 NOTE — Addendum Note (Signed)
 Addended by: Gwenevere Abbot on: 12/09/2023 05:35 PM   Modules accepted: Orders

## 2023-12-10 DIAGNOSIS — H53143 Visual discomfort, bilateral: Secondary | ICD-10-CM | POA: Diagnosis not present

## 2023-12-10 DIAGNOSIS — H524 Presbyopia: Secondary | ICD-10-CM | POA: Diagnosis not present

## 2024-01-18 DIAGNOSIS — I517 Cardiomegaly: Secondary | ICD-10-CM | POA: Diagnosis not present

## 2024-01-18 DIAGNOSIS — I700 Atherosclerosis of aorta: Secondary | ICD-10-CM | POA: Diagnosis not present

## 2024-01-18 DIAGNOSIS — I351 Nonrheumatic aortic (valve) insufficiency: Secondary | ICD-10-CM | POA: Diagnosis not present

## 2024-01-18 DIAGNOSIS — I5189 Other ill-defined heart diseases: Secondary | ICD-10-CM | POA: Diagnosis not present

## 2024-02-27 DIAGNOSIS — R21 Rash and other nonspecific skin eruption: Secondary | ICD-10-CM | POA: Diagnosis not present

## 2024-02-27 DIAGNOSIS — I7300 Raynaud's syndrome without gangrene: Secondary | ICD-10-CM | POA: Diagnosis not present

## 2024-02-27 DIAGNOSIS — M25541 Pain in joints of right hand: Secondary | ICD-10-CM | POA: Diagnosis not present

## 2024-02-27 DIAGNOSIS — M25542 Pain in joints of left hand: Secondary | ICD-10-CM | POA: Diagnosis not present

## 2024-04-29 DIAGNOSIS — G4733 Obstructive sleep apnea (adult) (pediatric): Secondary | ICD-10-CM | POA: Diagnosis not present

## 2024-07-09 DIAGNOSIS — M19042 Primary osteoarthritis, left hand: Secondary | ICD-10-CM | POA: Diagnosis not present

## 2024-07-09 DIAGNOSIS — M19041 Primary osteoarthritis, right hand: Secondary | ICD-10-CM | POA: Diagnosis not present

## 2024-07-09 DIAGNOSIS — M65332 Trigger finger, left middle finger: Secondary | ICD-10-CM | POA: Diagnosis not present

## 2024-08-18 DIAGNOSIS — M65342 Trigger finger, left ring finger: Secondary | ICD-10-CM | POA: Diagnosis not present

## 2024-10-06 ENCOUNTER — Encounter: Payer: Self-pay | Admitting: Radiology
